# Patient Record
Sex: Male | Born: 1960 | Race: Black or African American | Hispanic: No | Smoking: Current every day smoker
Health system: Southern US, Community
[De-identification: ages and names within clinical notes are randomized; demographics above are authoritative.]

## PROBLEM LIST (undated history)

## (undated) DIAGNOSIS — I509 Heart failure, unspecified: Secondary | ICD-10-CM

## (undated) DIAGNOSIS — E7800 Pure hypercholesterolemia, unspecified: Secondary | ICD-10-CM

## (undated) DIAGNOSIS — E119 Type 2 diabetes mellitus without complications: Secondary | ICD-10-CM

## (undated) DIAGNOSIS — I209 Angina pectoris, unspecified: Secondary | ICD-10-CM

## (undated) DIAGNOSIS — Z9289 Personal history of other medical treatment: Secondary | ICD-10-CM

## (undated) DIAGNOSIS — I639 Cerebral infarction, unspecified: Secondary | ICD-10-CM

## (undated) DIAGNOSIS — I10 Essential (primary) hypertension: Secondary | ICD-10-CM

## (undated) DIAGNOSIS — F419 Anxiety disorder, unspecified: Secondary | ICD-10-CM

## (undated) DIAGNOSIS — F32A Depression, unspecified: Secondary | ICD-10-CM

## (undated) DIAGNOSIS — F329 Major depressive disorder, single episode, unspecified: Secondary | ICD-10-CM

## (undated) DIAGNOSIS — I2510 Atherosclerotic heart disease of native coronary artery without angina pectoris: Secondary | ICD-10-CM

## (undated) DIAGNOSIS — I219 Acute myocardial infarction, unspecified: Secondary | ICD-10-CM

## (undated) HISTORY — DX: Personal history of other medical treatment: Z92.89

## (undated) HISTORY — PX: CHOLECYSTECTOMY: SHX55

---

## 2010-07-20 ENCOUNTER — Emergency Department (HOSPITAL_BASED_OUTPATIENT_CLINIC_OR_DEPARTMENT_OTHER): Admission: EM | Admit: 2010-07-20 | Discharge: 2010-07-21 | Payer: Self-pay | Admitting: Emergency Medicine

## 2010-07-21 ENCOUNTER — Ambulatory Visit: Payer: Self-pay | Admitting: Diagnostic Radiology

## 2010-09-08 DIAGNOSIS — Z9289 Personal history of other medical treatment: Secondary | ICD-10-CM

## 2010-09-08 DIAGNOSIS — I639 Cerebral infarction, unspecified: Secondary | ICD-10-CM

## 2010-09-08 HISTORY — DX: Cerebral infarction, unspecified: I63.9

## 2010-09-08 HISTORY — DX: Personal history of other medical treatment: Z92.89

## 2010-09-08 HISTORY — PX: CORONARY ARTERY BYPASS GRAFT: SHX141

## 2010-10-28 ENCOUNTER — Inpatient Hospital Stay (HOSPITAL_COMMUNITY)
Admission: EM | Admit: 2010-10-28 | Discharge: 2010-11-06 | DRG: 280 | Disposition: A | Discharge status: Home Health | Payer: Medicaid Other | Source: Other Acute Inpatient Hospital | Attending: Cardiovascular Disease | Admitting: Cardiovascular Disease

## 2010-10-28 ENCOUNTER — Emergency Department (INDEPENDENT_AMBULATORY_CARE_PROVIDER_SITE_OTHER): Payer: Self-pay

## 2010-10-28 ENCOUNTER — Emergency Department (HOSPITAL_BASED_OUTPATIENT_CLINIC_OR_DEPARTMENT_OTHER)
Admission: EM | Admit: 2010-10-28 | Discharge: 2010-10-28 | Disposition: A | Discharge status: Other Acute Inpatient Hospital | Payer: Self-pay | Attending: Emergency Medicine | Admitting: Emergency Medicine

## 2010-10-28 DIAGNOSIS — I4729 Other ventricular tachycardia: Secondary | ICD-10-CM | POA: Diagnosis present

## 2010-10-28 DIAGNOSIS — I472 Ventricular tachycardia, unspecified: Secondary | ICD-10-CM | POA: Diagnosis present

## 2010-10-28 DIAGNOSIS — J90 Pleural effusion, not elsewhere classified: Secondary | ICD-10-CM

## 2010-10-28 DIAGNOSIS — F17200 Nicotine dependence, unspecified, uncomplicated: Secondary | ICD-10-CM | POA: Diagnosis present

## 2010-10-28 DIAGNOSIS — I2510 Atherosclerotic heart disease of native coronary artery without angina pectoris: Secondary | ICD-10-CM | POA: Diagnosis present

## 2010-10-28 DIAGNOSIS — I517 Cardiomegaly: Secondary | ICD-10-CM | POA: Insufficient documentation

## 2010-10-28 DIAGNOSIS — I129 Hypertensive chronic kidney disease with stage 1 through stage 4 chronic kidney disease, or unspecified chronic kidney disease: Secondary | ICD-10-CM | POA: Diagnosis present

## 2010-10-28 DIAGNOSIS — I2789 Other specified pulmonary heart diseases: Secondary | ICD-10-CM | POA: Diagnosis present

## 2010-10-28 DIAGNOSIS — I10 Essential (primary) hypertension: Secondary | ICD-10-CM | POA: Insufficient documentation

## 2010-10-28 DIAGNOSIS — I509 Heart failure, unspecified: Secondary | ICD-10-CM | POA: Diagnosis present

## 2010-10-28 DIAGNOSIS — N183 Chronic kidney disease, stage 3 unspecified: Secondary | ICD-10-CM | POA: Diagnosis present

## 2010-10-28 DIAGNOSIS — F1210 Cannabis abuse, uncomplicated: Secondary | ICD-10-CM | POA: Diagnosis present

## 2010-10-28 DIAGNOSIS — I2582 Chronic total occlusion of coronary artery: Secondary | ICD-10-CM | POA: Diagnosis present

## 2010-10-28 DIAGNOSIS — E785 Hyperlipidemia, unspecified: Secondary | ICD-10-CM | POA: Diagnosis present

## 2010-10-28 DIAGNOSIS — R0602 Shortness of breath: Secondary | ICD-10-CM | POA: Insufficient documentation

## 2010-10-28 DIAGNOSIS — I2589 Other forms of chronic ischemic heart disease: Secondary | ICD-10-CM | POA: Diagnosis present

## 2010-10-28 DIAGNOSIS — I428 Other cardiomyopathies: Secondary | ICD-10-CM | POA: Diagnosis present

## 2010-10-28 DIAGNOSIS — Z794 Long term (current) use of insulin: Secondary | ICD-10-CM

## 2010-10-28 DIAGNOSIS — I5021 Acute systolic (congestive) heart failure: Secondary | ICD-10-CM | POA: Diagnosis present

## 2010-10-28 DIAGNOSIS — E119 Type 2 diabetes mellitus without complications: Secondary | ICD-10-CM | POA: Diagnosis present

## 2010-10-28 DIAGNOSIS — I059 Rheumatic mitral valve disease, unspecified: Secondary | ICD-10-CM | POA: Diagnosis present

## 2010-10-28 DIAGNOSIS — E876 Hypokalemia: Secondary | ICD-10-CM | POA: Diagnosis not present

## 2010-10-28 DIAGNOSIS — I214 Non-ST elevation (NSTEMI) myocardial infarction: Principal | ICD-10-CM | POA: Diagnosis present

## 2010-10-28 DIAGNOSIS — Z23 Encounter for immunization: Secondary | ICD-10-CM

## 2010-10-28 LAB — BASIC METABOLIC PANEL
CO2: 29 mEq/L (ref 19–32)
Calcium: 8.3 mg/dL — ABNORMAL LOW (ref 8.4–10.5)
GFR calc Af Amer: >60 mL/min (ref >60)
GFR calc non Af Amer: 58 mL/min — ABNORMAL LOW (ref >60)
Sodium: 139 mEq/L (ref 135–145)

## 2010-10-28 LAB — CBC
Hemoglobin: 14.5 g/dL (ref 13.0–17.0)
MCHC: 32.5 g/dL (ref 30.0–36.0)
Platelets: 341 10*3/uL (ref 150–400)
RDW: 16.4 % — ABNORMAL HIGH (ref 11.5–15.5)

## 2010-10-28 LAB — POCT B-TYPE NATRIURETIC PEPTIDE (BNP): B Natriuretic Peptide, POC: 1390 pg/mL — ABNORMAL HIGH (ref 0–100)

## 2010-10-28 LAB — RAPID URINE DRUG SCREEN, HOSP PERFORMED
Amphetamines: NOT DETECTED
Benzodiazepines: NOT DETECTED
Cocaine: NOT DETECTED
Tetrahydrocannabinol: POSITIVE — AB

## 2010-10-28 LAB — DIFFERENTIAL
Basophils Absolute: 0 10*3/uL (ref 0.0–0.1)
Basophils Relative: 0 % (ref 0–1)
Eosinophils Absolute: 0.1 10*3/uL (ref 0.0–0.7)
Monocytes Absolute: 0.7 10*3/uL (ref 0.1–1.0)
Neutro Abs: 6.6 10*3/uL (ref 1.7–7.7)

## 2010-10-28 LAB — POCT CARDIAC MARKERS: Myoglobin, poc: 274 ng/mL (ref 12–200)

## 2010-10-29 LAB — CBC
HCT: 45.7 % (ref 39.0–52.0)
Hemoglobin: 14.6 g/dL (ref 13.0–17.0)
WBC: 11.9 10*3/uL — ABNORMAL HIGH (ref 4.0–10.5)

## 2010-10-29 LAB — BASIC METABOLIC PANEL
CO2: 33 mEq/L — ABNORMAL HIGH (ref 19–32)
Calcium: 8.3 mg/dL — ABNORMAL LOW (ref 8.4–10.5)
GFR calc Af Amer: 60 mL/min — ABNORMAL LOW (ref >60)
GFR calc non Af Amer: 49 mL/min — ABNORMAL LOW (ref >60)
Glucose, Bld: 106 mg/dL — ABNORMAL HIGH (ref 70–99)
Potassium: 2.9 mEq/L — ABNORMAL LOW (ref 3.5–5.1)
Sodium: 140 mEq/L (ref 135–145)

## 2010-10-29 LAB — GLUCOSE, CAPILLARY
Glucose-Capillary: 74 mg/dL (ref 70–99)
Glucose-Capillary: 132 mg/dL — ABNORMAL HIGH (ref 70–99)
Glucose-Capillary: 164 mg/dL — ABNORMAL HIGH (ref 70–99)

## 2010-10-29 LAB — CK TOTAL AND CKMB (NOT AT ARMC)
CK, MB: 3.8 ng/mL (ref 0.3–4.0)
Relative Index: 1.2 (ref 0.0–2.5)

## 2010-10-29 LAB — HEPARIN LEVEL (UNFRACTIONATED)
Heparin Unfractionated: 0.17 IU/mL — ABNORMAL LOW (ref 0.30–0.70)
Heparin Unfractionated: 0.51 IU/mL (ref 0.30–0.70)

## 2010-10-29 LAB — LIPID PANEL
Cholesterol: 166 mg/dL (ref 0–200)
HDL: 33 mg/dL — ABNORMAL LOW (ref >39)
Triglycerides: 66 mg/dL (ref <150)

## 2010-10-29 LAB — BRAIN NATRIURETIC PEPTIDE: Pro B Natriuretic peptide (BNP): 1582 pg/mL — ABNORMAL HIGH (ref 0.0–100.0)

## 2010-10-29 LAB — HEMOGLOBIN A1C: Hgb A1c MFr Bld: 8.6 % — ABNORMAL HIGH (ref <5.7)

## 2010-10-29 LAB — TROPONIN I: Troponin I: 7.26 ng/mL (ref 0.00–0.06)

## 2010-10-30 ENCOUNTER — Inpatient Hospital Stay (HOSPITAL_COMMUNITY): Payer: Medicaid Other

## 2010-10-30 LAB — CBC
Platelets: 279 10*3/uL (ref 150–400)
RBC: 4.86 MIL/uL (ref 4.22–5.81)
RDW: 15.5 % (ref 11.5–15.5)
WBC: 11 10*3/uL — ABNORMAL HIGH (ref 4.0–10.5)

## 2010-10-30 LAB — HEPARIN LEVEL (UNFRACTIONATED): Heparin Unfractionated: 0.37 IU/mL (ref 0.30–0.70)

## 2010-10-30 LAB — BRAIN NATRIURETIC PEPTIDE: Pro B Natriuretic peptide (BNP): 834 pg/mL — ABNORMAL HIGH (ref 0.0–100.0)

## 2010-10-30 LAB — GLUCOSE, CAPILLARY
Glucose-Capillary: 130 mg/dL — ABNORMAL HIGH (ref 70–99)
Glucose-Capillary: 47 mg/dL — ABNORMAL LOW (ref 70–99)
Glucose-Capillary: 52 mg/dL — ABNORMAL LOW (ref 70–99)

## 2010-10-30 LAB — BASIC METABOLIC PANEL
Calcium: 8.1 mg/dL — ABNORMAL LOW (ref 8.4–10.5)
GFR calc Af Amer: 57 mL/min — ABNORMAL LOW (ref >60)
GFR calc non Af Amer: 47 mL/min — ABNORMAL LOW (ref >60)
Sodium: 141 mEq/L (ref 135–145)

## 2010-10-30 NOTE — Consult Note (Signed)
NAMEMYRAN, Steven NO.:  [id number]  MEDICAL RECORD NO.:  [id number]           PATIENT TYPE:  I  LOCATION:  2926                         FACILITY:  [facility]  PHYSICIAN:  Mariea Stable, MD   DATE OF BIRTH:  [birth year]  DATE OF CONSULTATION:  10/28/2010 DATE OF DISCHARGE:                                CONSULTATION   REASON FOR CONSULTATION:  Glycemic control.  CHIEF COMPLAINT:  Dyspnea on exertion.  HISTORY OF PRESENT ILLNESS:  [name] a 50 year old gentleman with history significant for insulin-dependent diabetes mellitus, hypertension, hyperlipidemia who presents with chief complaint of dyspnea on exertion.  He states that he has been having shortness of breath for approximately 2 weeks in duration that has been progressive in nature.  He states that before this he was in his usual state of health.  He has had some chronic lower extremity swelling for some time, although he attributed this some time back to working on his feet for 12 hours initially and 8 hours per day more recently.  However, over this 2- week period, the dyspnea on exertion has been progressive to the point where he can walk more than just a few feet without getting short of breath.  He also reports episodes of PND where he will wake and gasp for air in the middle of the night, although he states it does not happen all that frequently.  He also endorses some orthopnea for which he uses two pillows.  He states that he has never had symptoms like this before. Of note, he denies ever having any chest pain or palpitations during this time period or in the past.  PAST MEDICAL HISTORY: 1. Insulin-dependent diabetes mellitus. 2. Hypertension. 3. Hyperlipidemia. 4. Substance use. 5. History of cholecystectomy.  MEDICATIONS:  The patient uses insulin 70/30 45 units subcu before breakfast and 35 units subcu before dinner.  ALLERGIES:  No known drug allergies.  SOCIAL HISTORY:  The  patient is currently separated and lives in [place].  He has a total of four siblings which include three sisters and one brother.  The youngest sister is currently here at bedside and he states she is one to be contacted in case of a medical emergency or decision-making needs to take place and her name is Canary Brim, phone number is [phone].  The patient reports smoking less than one pack per day for approximately 30 years.  He drinks alcohol socially, though he states that this is rather seldom.  He also admits to using no occasional marijuana.  FAMILY HISTORY:  Mother is alive at age 38 and has a history of hypertension, hypothyroidism.  Father is passed away in his 65s secondary to cirrhosis of the liver from alcohol use.  As stated above, the patient has four siblings and they have hypertension, diabetes and asthma.  REVIEW OF SYSTEMS:  As per HPI.  All others reviewed and negative.  PHYSICAL EXAMINATION:  VITAL SIGNS:  Blood pressure 180/138, heart rate of 87, oxygen saturation 100% on 2 liters via nasal cannula. GENERAL:  This is a middle-aged man lying in bed in no acute  distress. HEENT:  Head is normocephalic and atraumatic.  Pupils are equally round, reactive to light and accommodation.  Extraocular movements are intact. Sclerae anicteric.  There is poor dentition with missing teeth.  There are no oropharyngeal lesions. NECK:  Supple.  There is elevated jugular venous pressures with hepatojugular reflux. HEART:  There is an S1 and S2 with a regular rate and rhythm.  There is an S3 gallop with elevated JVP and hepatojugular reflux. LUNGS:  There is good air movement bilaterally, though there are crackles in the lower two thirds of the lung fields. ABDOMEN:  Positive bowel sounds, soft, nontender, nondistended with no rebound or guarding. LOWER EXTREMITIES:  There is +1 edema in the right lower extremity. There is +2 edema in the left lower extremity. NEUROLOGIC:   The patient is awake, alert and oriented x3.  Cranial nerves II through XII are intact.  Motor is intact.  Sensation is intact.  LABORATORY DATA:  WBCs 9.1, hemoglobin 14.5, platelet count 341.  Sodium 139, potassium 3.7, chloride 101, bicarb 29, glucose 189, BUN 15, creatinine 1.3, calcium 8.3.  BNP 1390.  Point-of-care cardiac markers show a myoglobin of 274, CK-MB of 2.7, and troponin-I of 4.99.  IMAGING: 1. Two-view chest x-ray shows bilateral lower lobe opacities,     suspicious for pneumonia.  There is also a right pleural effusion     with associated right lower lobe atelectasis and mild cardiomegaly. 2. Electrocardiogram shows a normal sinus rhythm with a ventricular     rate of 94.  There is left axis deviation associated with left     ventricular hypertrophy.  There are some nonspecific ST     abnormalities, but no obvious ischemic changes.  ASSESSMENT AND PLAN: 1. Congestive heart failure management currently per Cardiology.  The     patient is currently on a nitroglycerin drip along with some oral     antihypertensives.  He is also getting intravenous Lasix for     aggressive diuresis.  A 2-D echocardiogram is currently pending.ACE inhibitor has been initiated and beta-blocker will be initiated     if left ventricular function is found to be abnormal once the     patient has been diuresed and pulmonary edema has improved per     Cardiology. 2. Non-ST segment elevation myocardial infarction.  This may be     related to his congestive heart failure with markedly elevated     blood pressures.  However, he currently is on a heparin drip along     with a nitroglycerin drip as listed above.  He will also be started     on aspirin 325 mg p.o. daily.  Again, beta-blockers has currently     not been started secondary to his congestive heart failure and     pulmonary edema, although this will be started once this improves.     Again, 2-D echocardiogram is pending.  EKG does not  show any acute     ischemic changes and cardiac enzymes are being cycled currently. 3. Insulin-dependent diabetes mellitus.  The patient uses insulin     70/30 45 units before breakfast and 35 units before dinner.  At     this point, would continue with this regimen as this is the least     expensive insulin regimen.  However, we will not continue any     sliding scale insulin as the 70/30 contains short-acting insulin     and may cause some unintentional hypoglycemia.  We will check CBGs     before breakfast and before dinner to help provide information for     further adjustment of his 70/30.  Also was agree with ACE inhibitor     for blood pressure control given his congestive heart failure as     well as diabetes.  The patient will put in an order for diabetes     education.  Furthermore, the patient needs to be followed on an     outpatient basis for better control of his diabetes.  He would also     require an ophthalmology exam to screen for diabetic retinopathy as     an outpatient.  Also they can consider checking a urine     microalbumin to creatinine ratio for proteinuria, although the     patient has already been started on an ACE inhibitor, which would     be the treatment for this. 4. Hypertension.  Again, the patient is currently on a nitroglycerin     drip per #1.  He has also been started on ACE inhibitor as well as     calcium channel blocker.  The patient will likely be started on the     beta-blocker once his pulmonary edema resolves especially if his     left ventricular function is down.  Thank you for the opportunity to assist with the management of your patient.     Mariea Stable, MD     MA/MEDQ  D:  10/28/2010  T:  10/28/2010  Job:  [id number]  Electronically Signed by Mariea Stable MD on 10/30/2010 04:11:39 PM

## 2010-10-30 NOTE — Consult Note (Signed)
NAME:  LARKIN, MORELOS NO.:  [id number]  MEDICAL RECORD NO.:  [id number]           PATIENT TYPE:  I  LOCATION:  2926                         FACILITY:  [facility]  PHYSICIAN:  Nanetta Batty, M.D.   DATE OF BIRTH:  Sep 16, 1960  DATE OF CONSULTATION:  10/28/2010 DATE OF DISCHARGE:                                CONSULTATION   Mr. Fritze is a 50 year old separated African American male, father of 1 child, who works at [employer], and was admitted through [facility] for congestive heart failure and hypertension.  PAST MEDICAL HISTORY:  Remarkable for insulin-dependent diabetes over the last 8-10 years.  Despite having no primary care doctor, he still administers insulin to himself twice a day.  His cardiac risk factor profile is otherwise remarkable for tobacco abuse, smoking a thirds to half a pack a day for the last 25-30 years.  There is a history of hypertension, but he has not been on any antihypertensive medications. He has no family history that he knows of.  Over the last 2 weeks, he has noticed increasing dyspnea on exertion, lower extremity edema.  He denies chest pain.  He presented to [facility] with shortness of breath.  At that time, his exam was remarkable for elevated blood pressure, measured at 193/139.  His chest x-ray was remarkable for congestive heart failure.  His EKG showed LVH. His lab work was remarkable for an elevated BNP at 1400 and point-of- care marker troponin of 5.  He was heparinized, treated with IV diuretics and nitroglycerin and transferred to [facility] for further evaluation.  SURGICAL HISTORY:  Cholecystectomy.  ALLERGIES:  None.  MEDICATIONS:  Insulin 70/30, 45 units in the morning and 35 in the evening.  PHYSICAL EXAMINATION:  VITAL SIGNS:  Blood pressure is 188/130, pulse is 70. GENERAL:  The patient is alert and oriented, neurologically intact. LUNGS:  Reveal bibasilar crackles. NECK:  Carotids are  2+.  No bruits. HEART:  Reveals soft ejection murmur with a summation gallop. ABDOMEN:  Soft and nontender with active bowel sounds.  There is no hepatosplenomegaly, organomegaly, or audible bruits.  Femoral arteries are 2+ without bruits.  He does have 2+ pitting edema with diminished pedal pulses.  Twelve-lead EKG reveals sinus rhythm at 94 with evidence of LVH.  LABORATORY EXAM:  BNP 1390, sodium 139, potassium 3.7, chloride 101, CO2 29, glucose 189, BUN 15, creatinine 1.3.  Hemoglobin 14.5, hematocrit 44.6, platelet count 341, white count 9.1.  Chest x-ray shows cardiac enlargement with basilar infiltrates and interstitial edema.  IMPRESSION:  Mr. Bethel has congestive heart failure on exam and by biomarkers and chest x-ray.  He also has significant hypertension.  His risk factors in addition include insulin-dependent diabetes and ongoing tobacco abuse.  His laboratory exam shows preserved renal function and elevated troponin consistent with non-ST segment elevation myocardial infarction versus related to his CHF.  PLAN:  Will be to IV diurese him, treat his blood pressure with titratable intravenous nitrates, ACE inhibitors, and calcium channel blockers.  We will hold off on beta-blockers for the moment given the fact that he is in congestive  heart failure.  We will obtain a 2-D echo, cycle his enzymes.  I am also going to get the hospitalist service to assist [place] with the treatment of his diabetes and diabetic teaching nurse to seem him as well.  He may ultimately require right and left heart cath to define his anatomy and physiology.     Nanetta Batty, M.D.     Cordelia Pen  D:  10/28/2010  T:  10/29/2010  Job:  [id number]  cc:   [facility]  Electronically Signed by Nanetta Batty M.D. on 10/30/2010 11:35:46 AM

## 2010-10-31 DIAGNOSIS — Z01810 Encounter for preprocedural cardiovascular examination: Secondary | ICD-10-CM

## 2010-10-31 DIAGNOSIS — I2510 Atherosclerotic heart disease of native coronary artery without angina pectoris: Secondary | ICD-10-CM

## 2010-10-31 LAB — BRAIN NATRIURETIC PEPTIDE: Pro B Natriuretic peptide (BNP): 688 pg/mL — ABNORMAL HIGH (ref 0.0–100.0)

## 2010-10-31 LAB — BASIC METABOLIC PANEL
CO2: 32 mEq/L (ref 19–32)
Calcium: 8.5 mg/dL (ref 8.4–10.5)
Chloride: 99 mEq/L (ref 96–112)
GFR calc Af Amer: 55 mL/min — ABNORMAL LOW (ref >60)
Potassium: 3.8 mEq/L (ref 3.5–5.1)
Sodium: 139 mEq/L (ref 135–145)

## 2010-10-31 LAB — GLUCOSE, CAPILLARY
Glucose-Capillary: 208 mg/dL — ABNORMAL HIGH (ref 70–99)
Glucose-Capillary: 140 mg/dL — ABNORMAL HIGH (ref 70–99)

## 2010-10-31 LAB — CBC
Hemoglobin: 14.3 g/dL (ref 13.0–17.0)
MCH: 25.7 pg — ABNORMAL LOW (ref 26.0–34.0)
RBC: 5.56 MIL/uL (ref 4.22–5.81)

## 2010-11-01 LAB — CBC
HCT: 43.9 % (ref 39.0–52.0)
MCH: 25.8 pg — ABNORMAL LOW (ref 26.0–34.0)
MCHC: 31.7 g/dL (ref 30.0–36.0)
MCV: 81.6 fL (ref 78.0–100.0)
RDW: 15.6 % — ABNORMAL HIGH (ref 11.5–15.5)

## 2010-11-01 LAB — POCT I-STAT 3, VENOUS BLOOD GAS (G3P V)
TCO2: 33 mmol/L (ref 0–100)
pCO2, Ven: 43.9 mmHg — ABNORMAL LOW (ref 45.0–50.0)
pH, Ven: 7.462 — ABNORMAL HIGH (ref 7.250–7.300)
pO2, Ven: 27 mmHg — CL (ref 30.0–45.0)

## 2010-11-01 LAB — COMPREHENSIVE METABOLIC PANEL
Albumin: 2.3 g/dL — ABNORMAL LOW (ref 3.5–5.2)
Alkaline Phosphatase: 148 U/L — ABNORMAL HIGH (ref 39–117)
BUN: 20 mg/dL (ref 6–23)
Creatinine, Ser: 1.71 mg/dL — ABNORMAL HIGH (ref 0.4–1.5)
Potassium: 4 mEq/L (ref 3.5–5.1)
Total Protein: 6 g/dL (ref 6.0–8.3)

## 2010-11-01 LAB — GLUCOSE, CAPILLARY
Glucose-Capillary: 175 mg/dL — ABNORMAL HIGH (ref 70–99)
Glucose-Capillary: 143 mg/dL — ABNORMAL HIGH (ref 70–99)

## 2010-11-01 LAB — POCT I-STAT 3, ART BLOOD GAS (G3+)
Acid-Base Excess: 4 mmol/L — ABNORMAL HIGH (ref 0.0–2.0)
Acid-Base Excess: 7 mmol/L — ABNORMAL HIGH (ref 0.0–2.0)
Acid-Base Excess: 9 mmol/L — ABNORMAL HIGH (ref 0.0–2.0)
Bicarbonate: 31.6 mEq/L — ABNORMAL HIGH (ref 20.0–24.0)
Bicarbonate: 31.7 mEq/L — ABNORMAL HIGH (ref 20.0–24.0)
O2 Saturation: 78 %
O2 Saturation: 83 %
pCO2 arterial: 46.8 mmHg — ABNORMAL HIGH (ref 35.0–45.0)
pO2, Arterial: 46 mmHg — ABNORMAL LOW (ref 80.0–100.0)
pO2, Arterial: 70 mmHg — ABNORMAL LOW (ref 80.0–100.0)

## 2010-11-02 LAB — BASIC METABOLIC PANEL
BUN: 20 mg/dL (ref 6–23)
Creatinine, Ser: 1.54 mg/dL — ABNORMAL HIGH (ref 0.4–1.5)
GFR calc non Af Amer: 48 mL/min — ABNORMAL LOW (ref >60)

## 2010-11-02 LAB — CBC
MCH: 25.2 pg — ABNORMAL LOW (ref 26.0–34.0)
MCHC: 31.2 g/dL (ref 30.0–36.0)
MCV: 80.8 fL (ref 78.0–100.0)
Platelets: 265 10*3/uL (ref 150–400)
RDW: 15.6 % — ABNORMAL HIGH (ref 11.5–15.5)

## 2010-11-02 LAB — MAGNESIUM: Magnesium: 2.1 mg/dL (ref 1.5–2.5)

## 2010-11-03 LAB — CBC
MCH: 25.2 pg — ABNORMAL LOW (ref 26.0–34.0)
Platelets: 260 10*3/uL (ref 150–400)
RBC: 5.24 MIL/uL (ref 4.22–5.81)
WBC: 8 10*3/uL (ref 4.0–10.5)

## 2010-11-03 LAB — GLUCOSE, CAPILLARY
Glucose-Capillary: 143 mg/dL — ABNORMAL HIGH (ref 70–99)
Glucose-Capillary: 137 mg/dL — ABNORMAL HIGH (ref 70–99)
Glucose-Capillary: 135 mg/dL — ABNORMAL HIGH (ref 70–99)

## 2010-11-03 LAB — BASIC METABOLIC PANEL
Chloride: 98 mEq/L (ref 96–112)
Creatinine, Ser: 1.52 mg/dL — ABNORMAL HIGH (ref 0.4–1.5)
GFR calc Af Amer: 59 mL/min — ABNORMAL LOW (ref >60)
GFR calc non Af Amer: 49 mL/min — ABNORMAL LOW (ref >60)
Potassium: 3.8 mEq/L (ref 3.5–5.1)

## 2010-11-03 LAB — BRAIN NATRIURETIC PEPTIDE: Pro B Natriuretic peptide (BNP): 993 pg/mL — ABNORMAL HIGH (ref 0.0–100.0)

## 2010-11-04 ENCOUNTER — Inpatient Hospital Stay (HOSPITAL_COMMUNITY): Payer: Medicaid Other

## 2010-11-04 DIAGNOSIS — I2510 Atherosclerotic heart disease of native coronary artery without angina pectoris: Secondary | ICD-10-CM

## 2010-11-04 LAB — CBC
MCHC: 30.9 g/dL (ref 30.0–36.0)
Platelets: 235 10*3/uL (ref 150–400)
RDW: 15.5 % (ref 11.5–15.5)

## 2010-11-04 LAB — BASIC METABOLIC PANEL
Calcium: 8.7 mg/dL (ref 8.4–10.5)
Creatinine, Ser: 1.43 mg/dL (ref 0.4–1.5)
GFR calc Af Amer: >60 mL/min (ref >60)
GFR calc non Af Amer: 52 mL/min — ABNORMAL LOW (ref >60)
Sodium: 137 mEq/L (ref 135–145)

## 2010-11-04 LAB — GLUCOSE, CAPILLARY
Glucose-Capillary: 182 mg/dL — ABNORMAL HIGH (ref 70–99)
Glucose-Capillary: 127 mg/dL — ABNORMAL HIGH (ref 70–99)
Glucose-Capillary: 146 mg/dL — ABNORMAL HIGH (ref 70–99)

## 2010-11-05 LAB — BASIC METABOLIC PANEL
CO2: 32 mEq/L (ref 19–32)
Calcium: 9.1 mg/dL (ref 8.4–10.5)
Creatinine, Ser: 1.5 mg/dL (ref 0.4–1.5)
GFR calc Af Amer: 60 mL/min — ABNORMAL LOW (ref >60)
GFR calc non Af Amer: 49 mL/min — ABNORMAL LOW (ref >60)
Glucose, Bld: 138 mg/dL — ABNORMAL HIGH (ref 70–99)
Sodium: 140 mEq/L (ref 135–145)

## 2010-11-05 LAB — CBC
HCT: 43.7 % (ref 39.0–52.0)
MCH: 26 pg (ref 26.0–34.0)
MCHC: 31.8 g/dL (ref 30.0–36.0)
RDW: 15.7 % — ABNORMAL HIGH (ref 11.5–15.5)

## 2010-11-05 LAB — GLUCOSE, CAPILLARY
Glucose-Capillary: 154 mg/dL — ABNORMAL HIGH (ref 70–99)
Glucose-Capillary: 84 mg/dL (ref 70–99)
Glucose-Capillary: 158 mg/dL — ABNORMAL HIGH (ref 70–99)

## 2010-11-06 LAB — GLUCOSE, CAPILLARY
Glucose-Capillary: 109 mg/dL — ABNORMAL HIGH (ref 70–99)
Glucose-Capillary: 243 mg/dL — ABNORMAL HIGH (ref 70–99)
Glucose-Capillary: 105 mg/dL — ABNORMAL HIGH (ref 70–99)

## 2010-11-06 LAB — HEPATIC FUNCTION PANEL
AST: 25 U/L (ref 0–37)
Albumin: 2.4 g/dL — ABNORMAL LOW (ref 3.5–5.2)
Total Bilirubin: 0.9 mg/dL (ref 0.3–1.2)
Total Protein: 6.1 g/dL (ref 6.0–8.3)

## 2010-11-06 LAB — CBC
Hemoglobin: 13.8 g/dL (ref 13.0–17.0)
MCH: 25.2 pg — ABNORMAL LOW (ref 26.0–34.0)
MCHC: 31.5 g/dL (ref 30.0–36.0)
Platelets: 269 10*3/uL (ref 150–400)
RDW: 15.5 % (ref 11.5–15.5)

## 2010-11-06 LAB — BASIC METABOLIC PANEL
Calcium: 9.1 mg/dL (ref 8.4–10.5)
Creatinine, Ser: 1.57 mg/dL — ABNORMAL HIGH (ref 0.4–1.5)
GFR calc Af Amer: 57 mL/min — ABNORMAL LOW (ref >60)
GFR calc non Af Amer: 47 mL/min — ABNORMAL LOW (ref >60)
Sodium: 136 mEq/L (ref 135–145)

## 2010-11-06 LAB — BRAIN NATRIURETIC PEPTIDE: Pro B Natriuretic peptide (BNP): 727 pg/mL — ABNORMAL HIGH (ref 0.0–100.0)

## 2010-11-07 NOTE — Procedures (Signed)
NAMEGRAY, DOERING NO.:  [id number]  MEDICAL RECORD NO.:  [id number]           PATIENT TYPE:  LOCATION:                                 FACILITY:  PHYSICIAN:  Thereasa Solo. Jessicah Croll, M.D. DATE OF BIRTH:  1961/04/09  DATE OF PROCEDURE:  10/31/2010 DATE OF DISCHARGE:                           CARDIAC CATHETERIZATION   INDICATIONS FOR TEST:  This 50 year old male was admitted on October 28, 2010, with congestive heart failure and a non-Q-wave MI.  An echocardiogram shows an ejection fraction of approximately 20%.  His creatinine is elevated at 1.63, and because of this a formal ventriculogram was not performed in an attempt to conserve contrast media.  PROCEDURE:  The patient was prepped and draped in the usual sterile fashion exposing the right groin.  Following local anesthetic with 1% Xylocaine, the Seldinger technique was employed and a 5-French introducer sheath was placed in the right femoral artery and a 7-French introducer sheath in the right femoral vein.  A Swan-Ganz catheter was advanced through its normal route into the pulmonary artery with hemodynamic monitoring undertaken throughout each station.  Following this, cardiac output by thermodilution was performed and pulmonary artery saturations were obtained.  Left and right coronary arteriography and a hand injection through a 5- French catheter was performed.  COMPLICATIONS:  None.  TOTAL CONTRAST USED:  50 mL.  EQUIPMENT:  A 5-French diagnostic Judkins configuration catheters and a 6-French Swan-Ganz catheter.  I had to use a Swan wire to pass the Swan-Ganz catheter out of the right atrium.  The right atrium appeared to be massive.  RESULTS:  Hemodynamic monitoring:  Right atrial pressure 12, right ventricular pressure 55/2, pulmonary artery pressure 61/26, wedge was 30.  Central aortic pressure 131/90, left ventricular pressure 136/13, and the left ventricular end-diastolic pressure was  26.  Oxygen saturations on room air in the AO 96% and in the PA 53%.  Cardiac output by Fick 3.4 and by thermal dilution 3.6.  Cardiac index by Fick was 1.59 and 1.6 by thermo.  Oxygen saturations AO 96%, PA 53%.  VENTRICULOGRAPHY:  There was no calcification appreciated on fluoroscopy.  ANGIOGRAPHY: 1. Left main was normal. 2. Circumflex:  The circumflex was 100% occluded in its proximal     portion with right-to-left collaterals. 3. Ramus intermediate:  The ramus was a large vessel, long in length     and bifurcated.  In the midportion of the ramus prior to the     bifurcation was a proximal 80% area of narrowing. 4. LAD:  The LAD crossed the apex of the heart, in the midportion was     an 80% tubular area of narrowing that was about 15 mm in length.     The diagonal system was free of disease. 5. Right coronary artery:  This was a large dominant vessel and about     3 mm in diameter.  The proximal, mid, and distal right coronary     artery had mild irregularities.  The PDA and three posterolateral     vessels were visualized, and after the second posterolateral vessel     was  an area of subtotaled narrowing.  Hand injection into the left ventricle in an attempt to save contrast revealed the apical and anterior segments to be akinetic.  I could not calculate an ejection fraction but by echo it was 20%.  CONCLUSION: 1. Severe left ventricular dysfunction with an ejection fraction less     than 20% by echocardiogram. 2. 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 3. Pulmonary hypertension with pulmonary artery pressure of 61/26. 4. Massive right atrium. 5. Low cardiac index.  PLAN:  I have asked CVTS to evaluate the patient for consideration of revascularization once he is more stable.  I plan to aggressively treat his congestive failure and make sure that his renal insufficiency remains stable.           ______________________________ Thereasa Solo. Sanye Ledesma, M.D.     ABL/MEDQ  D:  10/31/2010  T:  10/31/2010  Job:  [id number]  cc:   Ritta Slot, MD CVTS.  Electronically Signed by Julieanne Manson M.D. on 11/07/2010 07:58:15 AM

## 2010-11-12 NOTE — Consult Note (Addendum)
NAMESEAN, MALINOWSKI NO.:  [id number]  MEDICAL RECORD NO.:  [id number]           PATIENT TYPE:  I  LOCATION:  2926                         FACILITY:  [facility]  PHYSICIAN:  Salvatore Decent. Dorris Fetch, M.D.DATE OF BIRTH:  19-Jan-1961  DATE OF CONSULTATION:  10/31/2010 DATE OF DISCHARGE:                                CONSULTATION   REASON FOR CONSULTATION:  Three-vessel coronary artery disease and congestive heart failure.  HISTORY OF PRESENT ILLNESS:  Mr. Bonn is a 50 year old gentleman with multiple cardiac risk factors including insulin-dependent diabetes, hypertension, hyperlipidemia.  He also has a history of substance abuse. He presented with a chief complaint of shortness of breath which had primarily been with exertion, started about 2 weeks prior to admission, but it had been progressive in nature, and now is having some episodes at rest.  He in retrospect realized that he has had some leg swelling that has really been persistent since November.  He had first noted this about a year ago when he started a new job where he was up on his feet for long periods of time.  It had resolved, but then in November it came back and just was persistent from that point on.  About 2 weeks ago, he began having dyspnea on exertion.  Initially was walking fair distance, but it got to the point where even going to the bathroom or walking across the room, he would get short of breath and had to gasp for air in the middle of the night, and would have to sleep on 2 pillows.  He denies any chest pain or tightness.  He was admitted on February 20, noted to be in congestive heart failure and also ruled in for a non-ST elevation MI with a troponin of 11.42.  His BNP was 1390.  On admission back in November, his BNP had been 582.  An echocardiogram was done, which revealed severe left ventricular dysfunction.  There was moderate to severe left ventricular hypertrophy and ejection  fraction was markedly reduced at 15-20%.  There was severe hypokinesis of the basal mid anteroseptal myocardium and the entire anterior myocardium.  There was mild mitral regurgitation.  Right ventricle was dilated with severely reduced systolic function.  There was mild tricuspid regurgitation.  Today, he underwent cardiac catheterization by Dr. Julieanne Manson where he was found to have elevated filling pressures. His right atrial pressure was 14, RV 55/2, PA 61/26 with a wedge of 30. His LVEDP was 26.  Cardiac output was 3.4 with an index of 1.59 by Fick, and 3.69 and 1.68 by thermodilution.  His coronary arteries showed occlusion of left circumflex.  Ramus vessel was large.  There was mid to distal 80% stenosis.  The LAD was diffusely diseased.  There was a segmental stenosis in 70-80% range in the midvessel.  The right coronary gave off multiple posterior descending and posterolateral branches, which were diseased.  Minimal contrast was used for the LV gram, but it was noted that there was severe left ventricular dysfunction.  PAST MEDICAL HISTORY: 1. Hypertension. 2. Hyperlipidemia. 3. Insulin-dependent diabetes, which has not been followed closely.  He has been managing himself. 4. Substance abuse.  PAST SURGICAL HISTORY:  Cholecystectomy.  MEDICATIONS PRIOR TO ADMISSION:  Insulin 70/30, 45 units subcu before breakfast, 35 units subcu before dinner.  He has no known drug allergies.  FAMILY HISTORY:  Significant for hypertension.  His father died of cirrhosis at a young age.  SOCIAL HISTORY:  The patient is separated from his wife.  He lives in [place].  He works at [employer].  Occasionally uses marijuana. Drinks socially.  Denies tobacco abuse.  REVIEW OF SYSTEMS:  See HPI.  All other systems are negative.  PHYSICAL EXAMINATION:  GENERAL:  Mr. Braley is a 50 year old gentleman, in no acute distress.  He appears well developed and well nourished. VITAL SIGNS:   Blood pressure is 169/108, pulse 75, respirations are 16, his oxygen saturation is 97% on 2 liters nasal cannula. NEUROLOGIC:  He is alert and oriented x3 with no focal deficits. HEENT:  Remarkable for poor dentition with missing teeth. NECK:  Supple.  He does have elevated jugular venous waves. CARDIAC:  Has a regular rate and rhythm with S1 and S2.  There is an S3. I do not hear a murmur. LUNGS:  Have some mild rhonchi.  There are crackles in the bases. ABDOMEN:  Mildly distended, soft, nontender. EXTREMITIES:  There is chronic venous stasis changes in both lower extremities.  Pulses are difficult to palpate.  LABORATORY DATA:  Cardiac catheterization and echocardiogram as noted. Initial myoglobin was 274.  Initial troponin 4.99.  CK-MBs were negative, but troponin rose to 11.42.  Hemoglobin A1c was 8.6 with a mean plasma glucose of 200.  His white count is 9.9, hematocrit 45, platelets 291.  Sodium 139, potassium 3.8, BUN 17, creatinine 1.6.  IMPRESSION:  Mr. Curci is a 50 year old gentleman who presents with congestive heart failure.  He has severe left ventricular dysfunction. He also has 3-vessel coronary disease.  However, the degree of left ventricular dysfunction appears to be out of proportion to the severity of his coronary artery disease making for a difficult clinical situation.  He does not have tight proximal lesions and his coronary arteries are rather diffusely diseased, so unclear how much revascularization would benefit him in this setting.  It is also unclear to what degree his left ventricular dysfunction is due to his coronary artery disease versus possible other factors.  At the present time, he is receiving appropriate medical management for decompensated heart failure, and over the next several days, we can reassess him and consider repeat echocardiogram to see if there is any improvement in wall motion or we could do a viability scan to assess the degree  to which the myocardium is viable, although that would not necessarily give [place] a direct indication of how much benefit he would get from revascularization again given the nature of his coronary artery disease. We will follow along with the patient clinically and see what develops.     Salvatore Decent Dorris Fetch, M.D.     SCH/MEDQ  D:  10/31/2010  T:  11/01/2010  Job:  [id number]  cc:   Thereasa Solo. Little, M.D.  Electronically Signed by Charlett Lango M.D. on 11/12/2010 12:28:39 PM Electronically Signed by Charlett Lango M.D. on 11/12/2010 12:40:01 PM Electronically Signed by Charlett Lango M.D. on 11/12/2010 12:58:11 PM Electronically Signed by Charlett Lango M.D. on 11/12/2010 01:16:51 PM Electronically Signed by Charlett Lango M.D. on 11/12/2010 01:38:43 PM Electronically Signed by Charlett Lango M.D. on 11/12/2010 01:58:43 PM Electronically Signed by Viviann Spare  Evelisse Szalkowski M.D. on 11/12/2010 02:18:37 PM Electronically Signed by Charlett Lango M.D. on 11/12/2010 02:41:08 PM Electronically Signed by Charlett Lango M.D. on 11/12/2010 03:04:26 PM Electronically Signed by Charlett Lango M.D. on 11/12/2010 03:29:18 PM Electronically Signed by Charlett Lango M.D. on 11/12/2010 03:55:21 PM Electronically Signed by Charlett Lango M.D. on 11/12/2010 04:03:08 PM Electronically Signed by Charlett Lango M.D. on 11/12/2010 04:34:19 PM Electronically Signed by Charlett Lango M.D. on 11/12/2010 05:05:48 PM Electronically Signed by Charlett Lango M.D. on 11/12/2010 05:05:48 PM Electronically Signed by Charlett Lango M.D. on 11/12/2010 05:33:23 PM Electronically Signed by Charlett Lango M.D. on 11/12/2010 05:33:23 PM Electronically Signed by Charlett Lango M.D. on 11/12/2010 06:15:50 PM Electronically Signed by Charlett Lango M.D. on 11/12/2010 06:23:16 PM Electronically Signed by Charlett Lango M.D. on 11/12/2010 07:36:57 PM

## 2010-11-19 LAB — URINALYSIS, ROUTINE W REFLEX MICROSCOPIC
Bilirubin Urine: NEGATIVE
Ketones, ur: NEGATIVE mg/dL
Leukocytes, UA: NEGATIVE
Nitrite: NEGATIVE
Protein, ur: 30 mg/dL — AB
Urobilinogen, UA: 1 mg/dL (ref 0.0–1.0)

## 2010-11-19 LAB — COMPREHENSIVE METABOLIC PANEL
ALT: 27 U/L (ref 0–53)
Alkaline Phosphatase: 87 U/L (ref 39–117)
BUN: 15 mg/dL (ref 6–23)
CO2: 29 mEq/L (ref 19–32)
GFR calc non Af Amer: 58 mL/min — ABNORMAL LOW (ref >60)
Glucose, Bld: 109 mg/dL — ABNORMAL HIGH (ref 70–99)
Potassium: 3.2 mEq/L — ABNORMAL LOW (ref 3.5–5.1)
Sodium: 144 mEq/L (ref 135–145)
Total Bilirubin: 1.1 mg/dL (ref 0.3–1.2)
Total Protein: 6.7 g/dL (ref 6.0–8.3)

## 2010-11-19 LAB — POCT B-TYPE NATRIURETIC PEPTIDE (BNP): B Natriuretic Peptide, POC: 582 pg/mL — ABNORMAL HIGH (ref 0–100)

## 2010-11-19 LAB — URINE MICROSCOPIC-ADD ON

## 2010-11-20 NOTE — Discharge Summary (Signed)
[name] NO.:  [id number]  MEDICAL RECORD NO.:  [id number]           PATIENT TYPE:  I  LOCATION:  4707                         FACILITY:  [facility]  PHYSICIAN:  [name], M.D.   DATE OF BIRTH:  [birth year]  DATE OF ADMISSION:  10/28/2010 DATE OF DISCHARGE:  11/06/2010                              DISCHARGE SUMMARY   DISCHARGE DIAGNOSES: 1. Acute systolic heart failure, which is a new diagnosis. 2. Non-ST elevation myocardial infarction. 3. 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 4. Nonsustained ventricular tachycardia. 5. Coronary artery disease with 100% proximally occluded circ with     right-to-left collaterals, ramus with 80% stenosis, LAD with 80%     mid stenosis, RCA dominant vessel with minor irregularities in     proximal mid and distal vessels.     a.     [facility] vascular surgeons evaluated with plans for viability      MRI as an outpatient and further evaluation for bypass grafting. 6. Tobacco use.  The patient underwent educational tobacco cessation. 7. Hypertension, untreated prior to admission. 8. Dyslipidemia, now treated. 9. Renal insufficiency, chronic kidney disease stage III. 10.Diabetes mellitus type 2, insulin dependent. 11.Financial stressors, unable to afford recommended Januvia, will     return to insulin at discharge and will follow up with primary     care.  DISCHARGE CONDITION:  Improved.  PROCEDURES:  Combined left heart cath by Dr. Julieanne Manson October 31, 2010, right and left heart cath.  EDUCATION: 1. Diabetic and low-salt diet education, 2-gram sodium. 2. Tobacco cessation education.  CONSULTS:  With Dr. Andrey Spearman for bypass grafting with instructions reevaluation after heart failure improved and MRI viability completed.  Please note, the patient's weight at discharge as 81.4 kg down from 95.5 kg on admission.  DISCHARGE INSTRUCTIONS: 1. Increase activity  slowly.  May shower.  No lifting for 1 week.  No     driving for 1 week.  No sexual activity for 1 week. 2. No return to work until Dr. Clarene Duke clears. 3. Low-sodium, heart-healthy, diabetic diet. 4. Were LifeVest. 5. Wash cath site with soap and water.  Call if any bleeding,     swelling, or drainage. 6. Follow up with Dr. Clarene Duke on November 14, 2010, at 11 a.m. 7. Dr. Clarene Duke will have blood work done of basic metabolic panel.  HISTORY OF PRESENT ILLNESS:  A 50 year old gentleman with multiple cardiac risk factors, insulin-dependent diabetes, hypertension, hyperlipidemia, and substance abuse, presented on October 31, 2010, with shortness of breath, which is primarily with exertion and started 2 weeks prior to admission, progressive in nature, and is now having some episodes at rest, and may have had leg swelling since November.  He has been on his feet for long periods of time with a new job.  His shortness of breath became, he was gasping for air in the middle of the night, sleeping on 2 pillows.  Denied chest pain or tightness, was admitted on February 20.  His x-ray revealed congestive heart failure.  EKG with LVH.  His cardiac markers  were positive with troponin of 5, was placed on IV heparin, treated with IV diuretics and nitroglycerin, and transferred to [facility] where he was given further diuretics and planned for cardiac catheterization once his heart failure was improved.  Two-D echo was obtained.  Two-D echo revealed EF of 15-20%.  He diuresed well.  His enzymes were positive with elevated troponin, the peak was 11.42 with a CK of 327, MB of 2.9.  The patient underwent cardiac catheterization, was found to have 3-vessel coronary disease, 4 mid vessel disease, 100% occluded circumflex.  Consult was obtained with Dr. Andrey Spearman for bypass grafting.  He felt that viability scan needed to be done when patient's heart failure was resolved.  Dr. Clarene Duke prior to discharge  talk to Dr. Eden Emms about MRI viability scan, and this will be arranged through the [facility] office with Dr. Eden Emms as an outpatient, and patient will be referred back to Dr. Dorris Fetch as needed.  The patient continued to diurese.  By February 29, he had ambulated with cardiac rehab and did quite well.  Nutritionist had talked about 2-gram sodium diet.  His blood pressure remained more elevated, so his lisinopril has been increased to 20 twice a day.  He does have some renal insufficiency, we will need to monitor his creatinine.  He continued to diurese well on 40 of Lasix b.i.d. with negative 4-5 liters.  Weight dropped significantly and was cut back to 40 mg at discharge.  Due to the patient's low EF 15-20%, his ischemia and his nonsustained ventricular tachycardia, he is at high risk for sudden cardiac death syndrome and we requested LifeVest, which was approved, and the patient will be discharged with a LifeVest in place.  Please note during hospitalization, [facility] assisted as patient did not have primary care with his diabetes and he was placed on Januvia which he did excellent job.  Unfortunately as an outpatient, this medication will cost the patient 235 dollars.  He will be on several medications that will be 4 dollars.  The patient states he cannot afford the Januvia.  Glycohemoglobin was 8.1.  We would like it somewhat tighter.  We will leave him on 70/30 of insulin and have him followup with the primary care.  We have asked [facility] to see him, and they instructed him to come to the center to pick up the packet from [facility] at [facility] to establish eligibility or pay 60-dollar copay.  The patient was given that information.  Also, we will write 3-day supply prescription at discharge and will have [facility] manage her heart failure management.  LABORATORY DATA:  Blood gases on room air, pH 7.540, pCO2 36.9, pO2 70; pH  7.462, pCO2 43.9, pO2 27.  Hemoglobin at discharge 13.8, hematocrit 43.8, WBC 7.1, and platelets 269.  Chemistry:  Sodium 136, potassium 4.3, chloride 99, CO2 30, glucose 104, BUN 21, creatinine 1.57, total bili 0.9, direct bili 0.3, indirect bili 0.6, alkaline phos 130, SGOT 25, SGPT 13, total protein 6.1, albumin 2.4, and calcium 9.1, magnesium was 2.3.  Accu-Cheks ranged from 243 to 109.  BNP on admission was 1582 and gradually decreased during the hospitalization, and at discharge is 727. Cholesterol panel:  Total cholesterol 166, triglycerides 66, HDL 33, LDL 120.  His drug screen was positive for marijuana.  MRSA screen was negative.  Chest x-ray on admission, bilateral lower lobe opacity suspicious for pneumonia, right pleural effusion with associated right lower lobe atelectasis, mild  cardiomegaly.  Followup on the 22nd, atelectasis noted at the left base, worsening aeration to the right lung base, bilateral pleural effusions, and on the 27th chest x-ray improved aeration at the lung bases, small bilateral pleural effusions without appreciable change.  VITAL SIGNS:  At discharge, blood pressure was 157/82, pulse 60, respiratory rate 20, temperature 97.9, oxygen saturation room air was 98%. HEART:  Regular rate and rhythm. LUNGS:  He had a few crackles in his left base. ABDOMEN:  Positive bowel sounds. EXTREMITIES: No edema, which he had 2+ pitting edema on admission, and this was entirely gone.  The patient has not had any further ventricular tachycardia.  EKGs, sinus rhythm, biatrial enlargement, anterior infarction.  Two-D echo on October 29, 2010, EF 15-20%, moderate diffuse hypokinesis with severe hypokinesis of the basal mid inferoseptal myocardium.  There is severe hypokinesis in the entire anterior myocardium, mild mitral regurgitation.  On carotid Dopplers, no evidence of ICA stenosis.  DISCHARGE MEDICATION:  See medication reconciliation sheet.  DISCHARGE  INSTRUCTIONS:  As previously stated.     [name]. [name], N.P.   ______________________________ [name], M.D.    LRI/MEDQ  D:  11/06/2010  T:  11/07/2010  Job:  [id number]  cc:   Thereasa Solo. Little, M.D.  Electronically Signed by Nada Boozer N.P. on 11/07/2010 04:54:09 AM Electronically Signed by [name] M.D. on 11/20/2010 01:57:42 PM

## 2010-11-22 ENCOUNTER — Other Ambulatory Visit: Payer: Self-pay | Admitting: Cardiology

## 2010-11-22 DIAGNOSIS — R9430 Abnormal result of cardiovascular function study, unspecified: Secondary | ICD-10-CM

## 2010-11-22 DIAGNOSIS — IMO0002 Reserved for concepts with insufficient information to code with codable children: Secondary | ICD-10-CM

## 2010-11-25 ENCOUNTER — Ambulatory Visit: Payer: Self-pay

## 2010-12-02 ENCOUNTER — Ambulatory Visit (HOSPITAL_COMMUNITY)
Admission: RE | Admit: 2010-12-02 | Discharge: 2010-12-02 | Disposition: A | Discharge status: Home / Self Care | Payer: Self-pay | Source: Ambulatory Visit | Attending: Cardiology | Admitting: Cardiology

## 2010-12-02 DIAGNOSIS — I2589 Other forms of chronic ischemic heart disease: Secondary | ICD-10-CM

## 2010-12-02 DIAGNOSIS — I517 Cardiomegaly: Secondary | ICD-10-CM | POA: Insufficient documentation

## 2010-12-02 DIAGNOSIS — R9430 Abnormal result of cardiovascular function study, unspecified: Secondary | ICD-10-CM

## 2010-12-02 MED ORDER — GADOPENTETATE DIMEGLUMINE 469.01 MG/ML IV SOLN
40.0000 mL | Freq: Once | INTRAVENOUS | Status: AC
  Administered 2010-12-02: 40 mL via INTRAVENOUS

## 2010-12-10 ENCOUNTER — Encounter: Payer: Self-pay | Attending: Cardiovascular Disease | Admitting: Dietician

## 2010-12-19 ENCOUNTER — Encounter (INDEPENDENT_AMBULATORY_CARE_PROVIDER_SITE_OTHER): Payer: Self-pay | Admitting: Thoracic Surgery (Cardiothoracic Vascular Surgery)

## 2010-12-19 DIAGNOSIS — I2510 Atherosclerotic heart disease of native coronary artery without angina pectoris: Secondary | ICD-10-CM

## 2010-12-20 NOTE — H&P (Signed)
HISTORY AND PHYSICAL EXAMINATION  December 19, 2010  Re:  [name], [name]          DOB:  [birth year]  HISTORY OF PRESENT ILLNESS:  The patient is a 50 year old gentleman who was admitted in February 2012 at [facility] with acute systolic and diastolic heart failure.  This was a new diagnosis.  His workup revealed severe left ventricular dysfunction with ejection fraction estimated at 50% by echocardiogram.  He was in severe failure with a BNP of greater than 1000.  He had elevated troponins and ruled in for non-ST elevation MI.  He also had some nonsustained ventricular tachycardia while in the hospital.  He had elevated filling pressures on right heart catheterization.  His cardiac output and index of less than 1.7 by Fick and thermodilution.  He had three-vessel coronary disease.  The LAD was diffusely diseased.  There was 80% stenosis and a large ramus.  His circumflex was totally occluded.  His right coronary gave off multiple posterior descending posterolateral branches all of which were diseased but relatively small.  He was not a candidate for surgery at that time and the plan was to treat him medically and then reassess his cardiac function and assess viability.  That was done with a MRI on December 02, 2010 which showed left ventricular hypertrophy.  There was subendocardial scar in the anterior and septal walls.  Ejection fraction was estimated 42% quantitatively but appeared to be 30-35% qualitatively.  The patient states that since his discharge from the hospital, he has not been having any problem with shortness of breath or wheezing.  He does gets swelling in his legs, left greater than right. It comes and goes.  He has not had orthopnea or paroxysmal nocturnal dyspnea.  He says his blood sugars have been well controlled with sugars that are less than 200.  PAST MEDICAL HISTORY: 1. Acute systolic and diastolic congestive heart failure. 2.  Non-ST-elevation MI. 3. Ischemic cardiomyopathy, ejection fraction of 15-20%, question     nonischemic component. 4. Nonsustained ventricular tachycardia. 5. Three-vessel coronary artery disease. 6. Tobacco abuse, quit after hospitalization in February. 7. Hypertension. 8. Dyslipidemia. 9. Chronic kidney disease, stage III. 10.Type 2 insulin-dependent diabetes.  CURRENT MEDICATIONS: 1. Lisinopril 20 mg p.o. b.i.d. 2. Lasix 40 mg daily. 3. Amlodipine 10 mg daily. 4. Digoxin 0.125 mg daily. 5. Aspirin 325 mg daily. 6. Coreg 12.5 mg b.i.d. 7. Simvastatin 40 mg daily. 8. Spironolactone 12.5 mg b.i.d. 9. Imdur 60 mg daily. 10.Novolin insulin 45 units subcu q.a.m. and 35 units subcu q.p.m. 11.Sublingual nitroglycerin p.r.n. which he has not been having to     take.  ALLERGIES:  He has no known drug allergies.  FAMILY HISTORY:  Significant for hypertension.  SOCIAL HISTORY:  The patient is separated.  He currently lives with his mother.  She is 81 and in good health.  He works at [employer].  Does occasionally use marijuana.  He drinks socially.  He denies any current tobacco abuse.  REVIEW OF SYSTEMS:  As noted in HPI, otherwise all systems are negative.  PHYSICAL EXAMINATION:  General:  The patient is a 50 year old gentleman in no acute distress.  He is well developed and well nourished.  Vital Signs:  Blood pressure 139/85, pulse 67, respirations 28, and ox saturation is 98% on room air.  Neurologic:  He is alert, oriented x3 with no focal deficits.  HEENT:  Unremarkable.  Neck:  Supple without thyromegaly, adenopathy, or bruits.  Cardiac:  Regular rate and rhythm. Normal S1-S2.  There are no rubs or murmurs.  Lungs:  Clear with equal breath sounds bilaterally, although somewhat diminished in both bases. Abdomen:  Soft and nontender.  Extremities:  Chronic venous stasis changes in the left lower leg.  There is some edema but without the chronic changes present in the  right leg.  Distal pulses are difficult to palpate in both lower extremities.  LABORATORY DATA:  Cardiac catheterization, echocardiogram, and cardiac MI reviewed, findings as previously noted.  IMPRESSION:  The patient is a 50 year old gentleman with three-vessel coronary disease with severe left ventricular dysfunction, recently admitted with decompensated heart failure.  He is now much better compensated and has shown some improvement in left ventricular function. He does appear to have a viable myocardium by MRI.  Although his coronary disease is not ideal for coronary bypass grafting, he does have significant enough disease to warrant coronary artery bypass grafting. Given his three-vessel disease and severe left jugular dysfunction, there is both survival benefit and hopefully some improvement in his cardiac function.  I discussed in detail with the patient the indications, risks, benefits, and alternative treatments.  He understands the nature of the procedure, need for general anesthesia, incisions to be used, expected hospital stay, and overall recovery.  I discussed in detail with the patient the potential risks.  He understands the risks that include but not limited to death, stroke, myocardial infarction, deep venous thrombosis, pulmonary embolism, bleeding, possible need for transfusions, infections as well as other organ system dysfunction including respiratory, renal, hepatic, or GI complications.  He is potentially at increased risk for early and/or late graft failure due to his diffuse disease and small target vessels.  The patient understands and accepts the risks of surgery.  He agrees to proceed.  We have scheduled him for Monday, Jan 27, 2011.  He will be admitted on the day of surgery.  Salvatore Decent Dorris Fetch, M.D. Electronically Signed  SCH/MEDQ  D:  12/19/2010  T:  12/20/2010  Job:  [id number]

## 2011-01-23 ENCOUNTER — Encounter (HOSPITAL_COMMUNITY)
Admission: RE | Admit: 2011-01-23 | Discharge: 2011-01-23 | Disposition: A | Discharge status: Home / Self Care | Payer: Medicaid Other | Source: Ambulatory Visit

## 2011-01-23 ENCOUNTER — Ambulatory Visit (HOSPITAL_COMMUNITY)
Admission: RE | Admit: 2011-01-23 | Discharge: 2011-01-23 | Disposition: A | Discharge status: Home / Self Care | Payer: Medicaid Other | Source: Ambulatory Visit | Attending: Thoracic Surgery (Cardiothoracic Vascular Surgery) | Admitting: Thoracic Surgery (Cardiothoracic Vascular Surgery)

## 2011-01-23 ENCOUNTER — Other Ambulatory Visit: Payer: Self-pay | Admitting: Thoracic Surgery (Cardiothoracic Vascular Surgery)

## 2011-01-23 ENCOUNTER — Inpatient Hospital Stay (HOSPITAL_COMMUNITY)
Admission: RE | Admit: 2011-01-23 | Discharge: 2011-01-23 | Disposition: A | Discharge status: Home / Self Care | Payer: Medicaid Other | Source: Ambulatory Visit | Attending: Thoracic Surgery (Cardiothoracic Vascular Surgery) | Admitting: Thoracic Surgery (Cardiothoracic Vascular Surgery)

## 2011-01-23 DIAGNOSIS — Z01810 Encounter for preprocedural cardiovascular examination: Secondary | ICD-10-CM | POA: Insufficient documentation

## 2011-01-23 DIAGNOSIS — I2510 Atherosclerotic heart disease of native coronary artery without angina pectoris: Secondary | ICD-10-CM

## 2011-01-23 DIAGNOSIS — Z01812 Encounter for preprocedural laboratory examination: Secondary | ICD-10-CM | POA: Insufficient documentation

## 2011-01-23 LAB — COMPREHENSIVE METABOLIC PANEL
ALT: 13 U/L (ref 0–53)
AST: 20 U/L (ref 0–37)
Albumin: 3.6 g/dL (ref 3.5–5.2)
Calcium: 9.5 mg/dL (ref 8.4–10.5)
GFR calc Af Amer: >60 mL/min (ref >60)
Sodium: 139 mEq/L (ref 135–145)
Total Protein: 7.5 g/dL (ref 6.0–8.3)

## 2011-01-23 LAB — HEMOGLOBIN A1C
Hgb A1c MFr Bld: 9.4 % — ABNORMAL HIGH (ref <5.7)
Mean Plasma Glucose: 223 mg/dL — ABNORMAL HIGH (ref <117)

## 2011-01-23 LAB — URINALYSIS, ROUTINE W REFLEX MICROSCOPIC
Ketones, ur: NEGATIVE mg/dL
Leukocytes, UA: NEGATIVE
Nitrite: NEGATIVE
Specific Gravity, Urine: 1.023 (ref 1.005–1.030)
Urobilinogen, UA: 1 mg/dL (ref 0.0–1.0)

## 2011-01-23 LAB — BLOOD GAS, ARTERIAL
Acid-Base Excess: 3.6 mmol/L — ABNORMAL HIGH (ref 0.0–2.0)
TCO2: 28.6 mmol/L (ref 0–100)
pCO2 arterial: 40 mmHg (ref 35.0–45.0)
pH, Arterial: 7.45 (ref 7.350–7.450)
pO2, Arterial: 91.2 mmHg (ref 80.0–100.0)

## 2011-01-23 LAB — TYPE AND SCREEN
ABO/RH(D): A POS
Antibody Screen: NEGATIVE

## 2011-01-23 LAB — CBC
HCT: 40 % (ref 39.0–52.0)
Platelets: 184 10*3/uL (ref 150–400)
RBC: 4.99 MIL/uL (ref 4.22–5.81)
RDW: 16.4 % — ABNORMAL HIGH (ref 11.5–15.5)
WBC: 8.3 10*3/uL (ref 4.0–10.5)

## 2011-01-23 LAB — PROTIME-INR: INR: 0.99 (ref 0.00–1.49)

## 2011-01-23 LAB — APTT: aPTT: 30 seconds (ref 24–37)

## 2011-01-27 ENCOUNTER — Inpatient Hospital Stay (HOSPITAL_COMMUNITY)
Admission: RE | Admit: 2011-01-27 | Discharge: 2011-02-01 | DRG: 236 | Disposition: A | Discharge status: Home / Self Care | Payer: Medicaid Other | Source: Ambulatory Visit | Attending: Thoracic Surgery (Cardiothoracic Vascular Surgery) | Admitting: Thoracic Surgery (Cardiothoracic Vascular Surgery)

## 2011-01-27 ENCOUNTER — Inpatient Hospital Stay (HOSPITAL_COMMUNITY): Payer: Medicaid Other

## 2011-01-27 DIAGNOSIS — D696 Thrombocytopenia, unspecified: Secondary | ICD-10-CM | POA: Diagnosis not present

## 2011-01-27 DIAGNOSIS — IMO0002 Reserved for concepts with insufficient information to code with codable children: Secondary | ICD-10-CM | POA: Diagnosis not present

## 2011-01-27 DIAGNOSIS — E8779 Other fluid overload: Secondary | ICD-10-CM | POA: Diagnosis not present

## 2011-01-27 DIAGNOSIS — I2510 Atherosclerotic heart disease of native coronary artery without angina pectoris: Secondary | ICD-10-CM

## 2011-01-27 DIAGNOSIS — I129 Hypertensive chronic kidney disease with stage 1 through stage 4 chronic kidney disease, or unspecified chronic kidney disease: Secondary | ICD-10-CM | POA: Diagnosis present

## 2011-01-27 DIAGNOSIS — N289 Disorder of kidney and ureter, unspecified: Secondary | ICD-10-CM | POA: Diagnosis not present

## 2011-01-27 DIAGNOSIS — E1169 Type 2 diabetes mellitus with other specified complication: Secondary | ICD-10-CM | POA: Diagnosis not present

## 2011-01-27 DIAGNOSIS — D62 Acute posthemorrhagic anemia: Secondary | ICD-10-CM | POA: Diagnosis not present

## 2011-01-27 DIAGNOSIS — I2589 Other forms of chronic ischemic heart disease: Secondary | ICD-10-CM | POA: Diagnosis present

## 2011-01-27 DIAGNOSIS — N9989 Other postprocedural complications and disorders of genitourinary system: Secondary | ICD-10-CM | POA: Diagnosis not present

## 2011-01-27 DIAGNOSIS — E785 Hyperlipidemia, unspecified: Secondary | ICD-10-CM | POA: Diagnosis present

## 2011-01-27 DIAGNOSIS — I252 Old myocardial infarction: Secondary | ICD-10-CM

## 2011-01-27 DIAGNOSIS — Z794 Long term (current) use of insulin: Secondary | ICD-10-CM

## 2011-01-27 DIAGNOSIS — N183 Chronic kidney disease, stage 3 unspecified: Secondary | ICD-10-CM | POA: Diagnosis present

## 2011-01-27 DIAGNOSIS — I5040 Unspecified combined systolic (congestive) and diastolic (congestive) heart failure: Secondary | ICD-10-CM | POA: Diagnosis present

## 2011-01-27 DIAGNOSIS — Z87891 Personal history of nicotine dependence: Secondary | ICD-10-CM

## 2011-01-27 DIAGNOSIS — Y832 Surgical operation with anastomosis, bypass or graft as the cause of abnormal reaction of the patient, or of later complication, without mention of misadventure at the time of the procedure: Secondary | ICD-10-CM | POA: Diagnosis not present

## 2011-01-27 LAB — CBC
MCH: 26.6 pg (ref 26.0–34.0)
MCHC: 33.6 g/dL (ref 30.0–36.0)
MCHC: 33.4 g/dL (ref 30.0–36.0)
MCV: 79.4 fL (ref 78.0–100.0)
Platelets: 126 10*3/uL — ABNORMAL LOW (ref 150–400)
Platelets: 152 10*3/uL (ref 150–400)
RBC: 3.72 MIL/uL — ABNORMAL LOW (ref 4.22–5.81)
RDW: 16.6 % — ABNORMAL HIGH (ref 11.5–15.5)
RDW: 16.7 % — ABNORMAL HIGH (ref 11.5–15.5)
WBC: 13.9 10*3/uL — ABNORMAL HIGH (ref 4.0–10.5)

## 2011-01-27 LAB — POCT I-STAT 3, ART BLOOD GAS (G3+)
Acid-base deficit: 1 mmol/L (ref 0.0–2.0)
Bicarbonate: 26 mEq/L — ABNORMAL HIGH (ref 20.0–24.0)
Bicarbonate: 23.9 mEq/L (ref 20.0–24.0)
O2 Saturation: 100 %
O2 Saturation: 100 %
O2 Saturation: 98 %
Patient temperature: 36.4
Patient temperature: 37.4
TCO2: 25 mmol/L (ref 0–100)
pCO2 arterial: 38.4 mmHg (ref 35.0–45.0)
pO2, Arterial: 307 mmHg — ABNORMAL HIGH (ref 80.0–100.0)

## 2011-01-27 LAB — POCT I-STAT, CHEM 8
Calcium, Ion: 1.18 mmol/L (ref 1.12–1.32)
Glucose, Bld: 170 mg/dL — ABNORMAL HIGH (ref 70–99)
HCT: 29 % — ABNORMAL LOW (ref 39.0–52.0)
TCO2: 25 mmol/L (ref 0–100)

## 2011-01-27 LAB — POCT I-STAT 4, (NA,K, GLUC, HGB,HCT)
Glucose, Bld: 277 mg/dL — ABNORMAL HIGH (ref 70–99)
Glucose, Bld: 83 mg/dL (ref 70–99)
Glucose, Bld: 129 mg/dL — ABNORMAL HIGH (ref 70–99)
HCT: 26 % — ABNORMAL LOW (ref 39.0–52.0)
HCT: 29 % — ABNORMAL LOW (ref 39.0–52.0)
Hemoglobin: 11.2 g/dL — ABNORMAL LOW (ref 13.0–17.0)
Hemoglobin: 8.8 g/dL — ABNORMAL LOW (ref 13.0–17.0)
Potassium: 3.6 mEq/L (ref 3.5–5.1)
Potassium: 3.6 mEq/L (ref 3.5–5.1)
Potassium: 3.8 mEq/L (ref 3.5–5.1)
Sodium: 140 mEq/L (ref 135–145)
Sodium: 141 mEq/L (ref 135–145)

## 2011-01-27 LAB — HEMOGLOBIN AND HEMATOCRIT, BLOOD
HCT: 26.8 % — ABNORMAL LOW (ref 39.0–52.0)
Hemoglobin: 8.9 g/dL — ABNORMAL LOW (ref 13.0–17.0)

## 2011-01-27 LAB — CREATININE, SERUM
Creatinine, Ser: 1.33 mg/dL (ref 0.4–1.5)
GFR calc non Af Amer: 56 mL/min — ABNORMAL LOW (ref >60)

## 2011-01-27 LAB — GLUCOSE, CAPILLARY: Glucose-Capillary: 293 mg/dL — ABNORMAL HIGH (ref 70–99)

## 2011-01-27 LAB — PROTIME-INR: Prothrombin Time: 16.8 seconds — ABNORMAL HIGH (ref 11.6–15.2)

## 2011-01-28 ENCOUNTER — Inpatient Hospital Stay (HOSPITAL_COMMUNITY): Payer: Medicaid Other

## 2011-01-28 LAB — GLUCOSE, CAPILLARY
Glucose-Capillary: 105 mg/dL — ABNORMAL HIGH (ref 70–99)
Glucose-Capillary: 115 mg/dL — ABNORMAL HIGH (ref 70–99)
Glucose-Capillary: 116 mg/dL — ABNORMAL HIGH (ref 70–99)
Glucose-Capillary: 120 mg/dL — ABNORMAL HIGH (ref 70–99)
Glucose-Capillary: 124 mg/dL — ABNORMAL HIGH (ref 70–99)
Glucose-Capillary: 126 mg/dL — ABNORMAL HIGH (ref 70–99)
Glucose-Capillary: 131 mg/dL — ABNORMAL HIGH (ref 70–99)
Glucose-Capillary: 132 mg/dL — ABNORMAL HIGH (ref 70–99)
Glucose-Capillary: 158 mg/dL — ABNORMAL HIGH (ref 70–99)
Glucose-Capillary: 96 mg/dL (ref 70–99)
Glucose-Capillary: 105 mg/dL — ABNORMAL HIGH (ref 70–99)
Glucose-Capillary: 131 mg/dL — ABNORMAL HIGH (ref 70–99)
Glucose-Capillary: 109 mg/dL — ABNORMAL HIGH (ref 70–99)
Glucose-Capillary: 128 mg/dL — ABNORMAL HIGH (ref 70–99)

## 2011-01-28 LAB — BASIC METABOLIC PANEL
BUN: 19 mg/dL (ref 6–23)
CO2: 26 mEq/L (ref 19–32)
Calcium: 8.6 mg/dL (ref 8.4–10.5)
Chloride: 107 mEq/L (ref 96–112)
Creatinine, Ser: 1.66 mg/dL — ABNORMAL HIGH (ref 0.4–1.5)
GFR calc Af Amer: 53 mL/min — ABNORMAL LOW (ref >60)

## 2011-01-28 LAB — CBC
Hemoglobin: 9.3 g/dL — ABNORMAL LOW (ref 13.0–17.0)
MCH: 26 pg (ref 26.0–34.0)
MCHC: 32.6 g/dL (ref 30.0–36.0)
MCV: 79.6 fL (ref 78.0–100.0)

## 2011-01-29 ENCOUNTER — Inpatient Hospital Stay (HOSPITAL_COMMUNITY): Payer: Medicaid Other

## 2011-01-29 LAB — BASIC METABOLIC PANEL
Calcium: 8.7 mg/dL (ref 8.4–10.5)
Creatinine, Ser: 1.53 mg/dL — ABNORMAL HIGH (ref 0.4–1.5)
GFR calc Af Amer: 58 mL/min — ABNORMAL LOW (ref >60)
Sodium: 141 mEq/L (ref 135–145)

## 2011-01-29 LAB — CBC
Hemoglobin: 9 g/dL — ABNORMAL LOW (ref 13.0–17.0)
MCHC: 32.3 g/dL (ref 30.0–36.0)
RDW: 17.6 % — ABNORMAL HIGH (ref 11.5–15.5)
WBC: 9.5 10*3/uL (ref 4.0–10.5)

## 2011-01-29 LAB — GLUCOSE, CAPILLARY
Glucose-Capillary: 114 mg/dL — ABNORMAL HIGH (ref 70–99)
Glucose-Capillary: 131 mg/dL — ABNORMAL HIGH (ref 70–99)
Glucose-Capillary: 161 mg/dL — ABNORMAL HIGH (ref 70–99)
Glucose-Capillary: 172 mg/dL — ABNORMAL HIGH (ref 70–99)
Glucose-Capillary: 92 mg/dL (ref 70–99)

## 2011-01-30 LAB — GLUCOSE, CAPILLARY
Glucose-Capillary: 51 mg/dL — ABNORMAL LOW (ref 70–99)
Glucose-Capillary: 76 mg/dL (ref 70–99)
Glucose-Capillary: 158 mg/dL — ABNORMAL HIGH (ref 70–99)

## 2011-01-30 LAB — CBC
Hemoglobin: 9 g/dL — ABNORMAL LOW (ref 13.0–17.0)
MCH: 26.7 pg (ref 26.0–34.0)
MCV: 83.1 fL (ref 78.0–100.0)
RBC: 3.37 MIL/uL — ABNORMAL LOW (ref 4.22–5.81)

## 2011-01-30 LAB — BASIC METABOLIC PANEL
BUN: 16 mg/dL (ref 6–23)
Chloride: 101 mEq/L (ref 96–112)
Glucose, Bld: 154 mg/dL — ABNORMAL HIGH (ref 70–99)
Potassium: 3.7 mEq/L (ref 3.5–5.1)

## 2011-01-31 LAB — GLUCOSE, CAPILLARY
Glucose-Capillary: 113 mg/dL — ABNORMAL HIGH (ref 70–99)
Glucose-Capillary: 115 mg/dL — ABNORMAL HIGH (ref 70–99)
Glucose-Capillary: 168 mg/dL — ABNORMAL HIGH (ref 70–99)
Glucose-Capillary: 101 mg/dL — ABNORMAL HIGH (ref 70–99)

## 2011-01-31 NOTE — Op Note (Signed)
[name], [name] NO.:  [id number]  MEDICAL RECORD NO.:  [id number]           PATIENT TYPE:  I  LOCATION:  2301                         FACILITY:  [facility]  PHYSICIAN:  Burna Forts, M.D.DATE OF BIRTH:  02/05/1961  DATE OF PROCEDURE:  01/27/2011 DATE OF DISCHARGE:                              OPERATIVE REPORT   Intraoperative transesophageal echocardiographic report.  INDICATIONS FOR PROCEDURE:  [name] is a 50 year old gentleman who presents today for coronary artery bypass grafting.  He has been asked by the attending surgeon, Dr. Charlett Lango, to place a TEE probe for evaluation of cardiac structures and function.  The patient is brought to the holding area on the morning of surgery where under local anesthesia with sedation, pulmonary artery and right arterial lines were placed.  He has been taken to the OR for routine induction of general anesthesia, after which the TEE probe was prepared and passed oropharyngeally into the stomach, then slightly withdrawn for imaging of the cardiac structures.  PRECARDIOPULMONARY BYPASS TEE EXAM: 1. Left ventricle:  The left ventricular chamber was seen initially in     the short axis view.  There is severe concentric left ventricular     hypertrophy appreciated in all segmental wall areas.  There is     overall slight diminution of contractile pattern and ejection     fraction with an estimated EF of around 40%.  Short-axis and long-     axis views again are remarkable only for the nearly 2-cm thickness     18- to 22-mm thickness of the anterior to posterior left     ventricular walls as well as overall views in the short axis view. 2. Mitral valve:  The mitral valve was initially seen in a four-     chamber view.  Anterior-posterior leaflets are well visualized.     The apparatus appears to be normal in its structure and function.     Color Doppler reveals essentially no regurgitant flow.  Multiple    views were obtained.  Again, this is a mitral valve apparatus. 3. Aortic valve.  This is seen initially in a short axis view with     three cusps easily apparent.  All mobile and opening appropriately     during systolic contraction.  There was no aortic stenosis or     aortic insufficiency appreciated in both the short and long-axis     views. 4. Right ventricle.  Tricuspid valve and right atrium were normal     structures in their size and function.  The patient was placed on cardiopulmonary bypass.  Hypothermia was begun.  At the end of the coronary bypass procedure, the patient was rewarmed and separated from cardiopulmonary bypass with the initial attempt.  POSTCARDIOPULMONARY PULMONARY BYPASS TEE EXAMINATION: 1. Left ventricle.  The left ventricular chamber is initially seen     again in the short-axis view.  There is slightly diminished left     ventricular contractility appreciated in this early post bypass     period with time and separation of cardiopulmonary bypass.  The     contractile  pattern did appear to improved.  Overall, it reached     the level of contractility as appreciated in the prebypass.  All     segmental chamber wall areas were contractile.  Short and     long-axis views were obtained.  Again, there were no significant     changes from the prebypass period. 2. The rest of cardiac examination was as previously described without     any significant changes.  The patient was then returned to the     cardiac intensive care unit in stable condition.          ______________________________ Burna Forts, M.D.     JTM/MEDQ  D:  01/27/2011  T:  01/28/2011  Job:  [id number]  Electronically Signed by Ester Rink M.D. on 01/31/2011 09:28:19 AM

## 2011-02-01 LAB — BASIC METABOLIC PANEL
BUN: 14 mg/dL (ref 6–23)
GFR calc non Af Amer: >60 mL/min (ref >60)
Glucose, Bld: 162 mg/dL — ABNORMAL HIGH (ref 70–99)
Potassium: 3.4 mEq/L — ABNORMAL LOW (ref 3.5–5.1)

## 2011-02-05 NOTE — Discharge Summary (Signed)
[name], [name] NO.:  [id number]  MEDICAL RECORD NO.:  [id number]           PATIENT TYPE:  I  LOCATION:  2017                         FACILITY:  [facility]  PHYSICIAN:  Salvatore Decent. Dorris Fetch, M.D.DATE OF BIRTH:  1960-10-11  DATE OF ADMISSION:  01/27/2011 DATE OF DISCHARGE:                              DISCHARGE SUMMARY   ADMITTING DIAGNOSES: 1. Newly diagnosed acute systolic and diastolic heart failure. 2. Coronary artery disease (with an ejection fraction of 20%). 3. History of non-ST segment elevation myocardial infarction. 4. History of insulin-dependent diabetes mellitus. 5. History of hypertension. 6. History of hyperlipidemia. 7. History of substance abuse. 8. History of ischemic cardiomyopathy (questionable nonischemic     component). 9. History of nonsustained ventricular tachycardia. 10.History of tobacco abuse (quit in February). 11.History of chronic kidney disease (stage III).  DISCHARGE DIAGNOSES: 1. Newly diagnosed acute systolic and diastolic heart failure. 2. Coronary artery disease (with an ejection fraction of 20%). 3. History of non-ST segment elevation myocardial infarction. 4. History of insulin-dependent diabetes mellitus. 5. History of hypertension. 6. History of hyperlipidemia. 7. History of substance abuse. 8. History of ischemic cardiomyopathy (questionable nonischemic     component). 9. History of nonsustained ventricular tachycardia. 10.History of tobacco abuse (quit in February). 11.History of chronic kidney disease (stage III). 12.Acute blood loss anemia. 13.Thrombocytopenia.  PROCEDURE: 1. CABG x4 in (LIMA to LAD, SVG to ramus intermediate, SVG to obtuse     marginal 1, SVG to posterior descending coronary artery) with EVH     from the right thigh and cath by Dr. Dorris Fetch on Jan 27, 2011. 2. Intraoperative TEE done by Dr. Luiz Blare on Jan 27, 2011.  HISTORY OF PRESENTING ILLNESS:  This is a 50 year old African  American male with the aforementioned past medical history who initially back in February, presented with shortness of breath with exertion, which then progressed to while he was at rest.  He also later stated he did have some leg swelling since this past November as well and later developed orthopnea (required at least two pillows as well).  He was admitted most recently on October 28, 2010 with CHF (BNP 3098) as well as a non-STEMI (troponin of 11.42).  An echocardiogram done showed severe left ventricular dysfunction, moderate-to-severe left ventricular hypertrophy, EF 15-20%, severe hypokinesis of the basal midanterior septal myocardium and the entire anterior myocardium, mild MR, right ventricle was dilated with severely reduced systolic function and there was mild tricuspid regurgitation.  He then underwent a cardiac cath with Dr. Clarene Duke on October 31, 2010, which showed an occlusion of the left circumflex, the ramus intermediate was a large vessel with a mid-to- distal 80% stenosis, diffusely diseased LAD, and disease of the posterior descending coronary artery, cardiac output was 3.4 with an index of 1.59 by Fick and the LVEDP was 26.  A cardiothoracic consultation was obtained with Dr. Dorris Fetch upon that admission.  It was ultimately decided that the patient would continue with medical management for decompensated heart failure.     Doree Fudge, PA   ______________________________ Salvatore Decent Dorris Fetch, M.D.    DZ/MEDQ  D:  01/30/2011  T:  01/30/2011  Job:  [id number]  Electronically Signed by Doree Fudge PA on 01/31/2011 08:54:56 AM Electronically Signed by Charlett Lango M.D. on 02/05/2011 03:36:13 PM

## 2011-02-05 NOTE — Discharge Summary (Signed)
[name], [name] NO.:  [id number]  MEDICAL RECORD NO.:  [id number]           PATIENT TYPE:  I  LOCATION:  2017                         FACILITY:  [facility]  PHYSICIAN:  Steven Decent. Dorris Roman, M.D.DATE OF BIRTH:  07/12/1961  DATE OF ADMISSION:  01/27/2011 DATE OF DISCHARGE:                              DISCHARGE SUMMARY   ADDENDUM:  He would be followed closely as an outpatient and then ultimately would require coronary artery bypass grafting surgery.  The patient was seen in the office by Dr. Dorris Roman on December 19, 2010.  He had not been having any problems with shortness of breath or wheezing, although he did have intermittent swelling of his lower extremities.  He also did not have any further orthopnea or PND.   It should be noted he did undergo carotid duplex, carotid ultrasound which revealed no significant internal carotid artery stenoses and the ABIs revealed 0.65-0.68 on the right and 0.53-0.56 on the left (moderate reduction in inflow bilaterally). Potential risks, complications and benefits of the surgery were discussed with the patient and he agreed to proceed with surgery. The patient was admitted to [facility] on Jan 27, 2011, in order to undergo CABG x4.  BRIEF HOSPITAL COURSE STAY:  The patient was extubated without difficulty early the evening of surgery.  He initially required A pacing as well as Neo-Synephrine.  Later, however, the Neo-Synephrine neo was weaned and he was on nitroglycerin.  He was placed on a low-dose beta- blocker, which was titrated accordingly.  Swan-Ganz, A-line, chest tubes, and Foley were all removed early in his postoperative course.  He was found to have acute blood loss anemia.  His H and H went as low as 9 and 27.9.  He did not require a postoperative transfusion.  He was also found to have mild thrombocytopenia.  Postoperatively, his platelet count did go down to 116,000; however, last platelet count was up  to 141,000.  He was volume overloaded and diuresed accordingly.  As previously stated, the patient had a history of insulin-dependent diabetes mellitus.  He was weaned off his insulin drip and he was restarted on his Novolin 70/30, which was titrated accordingly to control blood sugar.  The patient was felt surgically stable for transfer from the intensive care unit to [facility] for further convalescence on Jan 28, 2011.  He continued to progress with cardiac rehab. Currently on postop day #3, he is tolerating a diet.  He has not had a bowel movement yet, but he is passing flatus.  PHYSICAL EXAMINATION:  VITAL SIGNS:  He had T-max of 99.4.  His heart rate remains in the 90s, BP 122/61, O2 sat 96-97% was on 2 liters oxygen overnight, however, is now on room air.  Preop weight 90 kg, today's weight 92.5 kg.  CBGs 173, 155 and 161. CARDIOVASCULAR:  Regular rate and rhythm. PULMONARY:  Slight decrease in the bases. ABDOMEN:  Soft, nontender, occasional bowel sounds. EXTREMITIES:  Positive lower extremity edema right greater than left. Sternal, right lower extremity wounds are clean, dry and continuing to heal.  Provided the patient remains afebrile, hemodynamically stable, in  sinus rhythm and pending morning round evaluation, he will be surgically stable for discharge on Jan 31, 2011.  Prior to the patient's discharge, epicardial pacing wires and chest tube sutures will be removed.  Current laboratory studies are as follows:  BMET done on Jan 30, 2011; potassium 2.7 which has been supplemented, sodium 138, BUN and creatinine 16 and 1.42 respectively.  CBC on this date H and H 9 and 28, white blood cell count 11,500, platelet count 141,000.  Chest x-ray done on Jan 29, 2011 showed no pneumothorax, small bilateral pleural effusions with bibasilar atelectasis left greater than right, stable cardiomegaly.  Discharge instructions include the following: 1. Diet:  Low-sodium, heart-healthy  diabetic diet. 2. Activity:  The patient may walk up steps.  He may shower.  He is     not to lift more than 10 pounds for 4 weeks, not to drive until     after 4 weeks.  He did continue with his breathing exercise daily.     He is to walk daily and increase his frequency and duration as     tolerates. 3. Wound care.  The patient is to use soap and water on his wound.  He     is to contact the office for if any wound problems arise.  FOLLOWUP APPOINTMENTS: 1. The patient needs to contact [name] office for followup     appointment in 2 weeks. 2. The patient needs to contact his medical doctor regarding further     diabetes management (hemoglobin A1c preop 9.4). 3. The patient has an appointment to see Dr. Dorris Roman on February 20, 2011 at 12:45 p.m., 45 minutes prior to this office appointment, a     chest x-ray will be obtained.  DISCHARGE MEDICATIONS AT TIME OF DICTATION: 1. Robitussin DM syrup 15 mL p.o. q.4 h. p.r.n. cough. 2. Oxycodone 5 mg one to two tablets p.o. q. 4-6 h. p.r.n. pain. 3. Coreg 6.25 mg p.o. two times daily. 4. Enteric-coated aspirin 325 mg p.o. daily. 5. Tylenol Extra Strength 500 mg two tablets p.o. q.6 h. p.o. p.r.n.     mild pain. 6. Digoxin 0.125 mg  p.o. daily. 7. Lasix 40 meters p.o. daily. 8. Novolin 70/30, the patient currently taking 34 units in the morning     and 20 units at night; however, he is to titrate back to his preop     dose of 45 units in the morning and 35 units in the evening as his     glucose continues to increase. 9. Zocor 40 mg p.o. at bedtime.     Steven Fudge, PA   ______________________________ [name], M.D.    DZ/MEDQ  D:  01/30/2011  T:  01/30/2011  Job:  [id number]  cc:   Thereasa Solo. Roman, M.D.  Electronically Signed by Steven Fudge PA on 01/31/2011 08:56:39 AM Electronically Signed by Charlett Lango M.D. on 02/05/2011 03:36:15 PM

## 2011-02-05 NOTE — Op Note (Signed)
NAMESACRAMENTO, MONDS NO.:  [id number]  MEDICAL RECORD NO.:  [id number]           PATIENT TYPE:  I  LOCATION:  2301                         FACILITY:  [facility]  PHYSICIAN:  Salvatore Decent. Dorris Fetch, M.D.DATE OF BIRTH:  05/16/1961  DATE OF PROCEDURE:  01/27/2011 DATE OF DISCHARGE:                              OPERATIVE REPORT   PREOPERATIVE DIAGNOSIS:  Severe three-vessel coronary artery disease with ischemic cardiomyopathy.  POSTOPERATIVE DIAGNOSIS:  Severe three-vessel coronary artery disease with ischemic cardiomyopathy.  PROCEDURE:  Median sternotomy, extracorporeal circulation, coronary artery bypass grafting x4 (left internal mammary artery to LAD, saphenous vein graft to ramus intermedius, saphenous vein graft to obtuse marginal 1, saphenous vein graft to posterior descending).  SURGEON:  Salvatore Decent. Dorris Fetch, MD  ASSISTANT:  Sheliah Plane, MD  SECOND ASSISTANT:  Stephanie Acre Dasovich, PAANESTHESIA:  General.  FINDINGS:  PDA poor quality.  Remaining targets fair quality, but relatively diffusely diseased coronaries, inferior adhesions, moderate left ventricular dysfunction with ejection fraction estimated at 40-45% by echocardiogram pre and postbypass.  No significant valvular pathology.  CLINICAL NOTE:  [name] is a 50 year old gentleman who was admitted in February with acute systolic and diastolic heart failure.  He was found to have severe left ventricular dysfunction and uncompensated heart failure.  He ruled in for a non-ST-elevation MI.  He was not a candidate for surgery at that time and has been treated medically since then with medical treatment.  He shown improvement with improvement of his ejection fraction.  Demonstrated on cardiac MRI, it was found to have left ventricular hypertrophy, but the maturity of his myocardium was viable.  The patient was referred for consideration for coronary artery bypass grafting for survival benefit.   The indications, risks, benefits and alternatives were discussed in detail with the patient and he understood.  There was increased risk for graft failure due to diffusely diseased target vessels.  He understood and accepted the risks of surgery and agreed to proceed.  OPERATIVE NOTE:  [name] was brought to the preop holding area on Jan 27, 2011.  There, the Anesthesia Service placed a Swan-Ganz catheter and arterial blood pressure monitoring line, and established intravenous access.  Intravenous antibiotics were administered.  The patient was taken to the operating room, anesthetized and intubated. Transesophageal echocardiography was performed by Dr. Sharee Holster. Please refer to his separately dictated note for details, but it did show left ventricular dysfunction with ejection fraction of 40-45%, improved over the patient's initial ejection fraction in February of 15- 20%.  There was no significant valvular pathology.  Foley catheter was placed by the nursing staff.  The chest, abdomen, and legs were thenprepped and draped in usual sterile fashion.  An incision was made in the medial aspect of the right leg at the level of the knee.  The greater saphenous vein was harvested from the groin to the calf endoscopically.  It was a good-quality vessel.  Simultaneously, a median sternotomy was performed in the left internal mammary artery, it was harvested using standard technique.  The mammary artery was a good- quality vessel.  A 2000 units of heparin was  administered during the vessel harvest, remainder of the full-dose heparin was given prior to opening the pericardium.  The pericardium was opened.  The ascending aorta was of normal diameter, but was relatively elongated, it was cannulated via concentric 2-0 Ethibond pledgeted pursestring sutures.  A dual stage venous cannula was placed via pursestring suture in the right atrial appendage. Cardiopulmonary bypass was instituted  and the patient was cooled to 32 degrees Celsius.  The coronary arteries were inspected and anastomotic sites were chosen.  The conduits were inspected and were cut to length. A foam pad was placed in the pericardium to insulate the arm and protect the left phrenic nerve.  A temperature probe was placed in the myocardial septum and a cardioplegic cannula was placed in the ascending aorta.  The aorta was crossclamped.  The left ventricle was emptied via the aortic root vent.  Cardiac arrest was then achieved with a combination of cold antegrade blood cardioplegia and topical iced saline.  A 1500 mL of cardioplegia was administered.  The myocardial septal temperature cooled to 10 degrees Celsius.  The following distal anastomoses were performed.  First, a reverse saphenous vein graft was placed end-to-side to the obtuse marginal 1, this vessel was the dominant lateral branch.  It arose from the totally occluded circumflex.  There was a 1.5-mm partially intramyocardial vessel that was good quality at the site of the anastomosis, but did have some plaquing distally, although one and a half probe did pass distally.  The vein graft was anastomosed end-to- side with a running 7-0 Prolene suture.  At the completion of each anastomosis, probes were passed proximally and distally to ensure patency before tying the sutures.  Cardioplegia was administered down the each vein graft at its completion to assess flow and hemostasis.  Next, a reverse saphenous vein graft was placed end-to-side to the ramus intermedius.  This was an anterolateral vessel that bifurcated just beyond the anastomosis.  There was a 90% stenosis just proximal to the anastomosis.  Vessel was fair-quality.  The vein was anastomosed end-to- side with a running 7-0 Prolene suture and there was good flow and good hemostasis at this anastomosis.  Next, the inferior wall was inspected.  The patient had a large right coronary  artery which gave rise to five distal branches, four separate branches were identified in the dissection, but only one was large enough to be bypassable even it was small poor quality 1-mm target.  The vein was anastomosed to the posterior descending with a running 7-0 Prolene suture.  Probe did pass proximally and distally and there was adequate flow through the graft.  Next, the left internal mammary artery was brought through a window in the pericardium.  The distal end was beveled.  It was anastomosed end-to- side to the distal LAD, which was a 2-mm vessel, but did have diffuse disease with that and the wall was markedly thickened.  The mammary was anastomosed end-to-side with a running 8-0 Prolene suture.  At the completion of the mammary to LAD anastomosis, the bulldog clamp was briefly removed to inspect for hemostasis.  Immediate rapid septal rewarming was noted.  The bulldog clamp was replaced.  The mammary pedicle was tacked to the epicardial surface of the heart with 6-0 Prolene sutures.  Additional cardioplegia was administered down the vein grafts.  They were then cut to length.  The cardioplegic cannula was removed from the ascending aorta and the proximal vein graft anastomoses were performed to 4.5-mm punch aortotomies with  running 6-0 Prolene sutures.  At the completion of the final proximal anastomosis, the patient was placed in Trendelenburg position, lidocaine was administered.  The bulldog clamps were again removed from the left mammary artery.  The heat and aortic root were de-aired and aortic cross-clamp was removed.  The total cross- clamp time was 79 minutes.  The patient required two defibrillations and then was in sinus bradycardia thereafter.  All proximal and distal anastomoses were inspect for hemostasis as rewarming was completed.  Epicardial pacing wires were placed on the right ventricle and right atrium, and DDD pacing was initiated.  A dopamine  infusion was initiated at 3 mcg/kg/minute.  When the patient had rewarmed to a core temperature of 37 degrees Celsius, he was weaned from cardiopulmonary bypass on the first attempt.  Total bypass time was 125 minutes.  Initial cardiac index was greater than 2 liters per minute per meter squared.  The patient remained hemodynamically stable throughout the postbypass period.  Postbypass transesophageal echocardiography revealed no change in left ventricular function.  A test dose of protamine was administered and was well tolerated.  The atrial and aortic cannulae were removed.  The remainder of the protamine was administered without incident.  The chest was irrigated with warm normal saline.  Hemostasis was achieved.  A left pleural and single mediastinal chest tubes were placed through separate subcostal incisions.  The pericardium was reapproximated over the ascending aorta and base of the heart with interrupted 3-0 silk sutures.  The sternum was closed with interrupted heavy gauge stainless steel wires.  The pectoralis fascia, subcutaneous tissue, and skin were closed in a standard fashion.  All sponge, needle, and instrument counts were correct at the end of procedure.  There were no intraoperative complications.  The patient was taken from the operating room to the surgical intensive care unit in good condition.     Salvatore Decent Dorris Fetch, M.D.     SCH/MEDQ  D:  01/27/2011  T:  01/28/2011  Job:  [id number]  cc:   Thereasa Solo. Little, M.D.  Electronically Signed by Charlett Lango M.D. on 02/05/2011 03:36:11 PM

## 2011-02-19 ENCOUNTER — Other Ambulatory Visit: Payer: Self-pay | Admitting: Thoracic Surgery (Cardiothoracic Vascular Surgery)

## 2011-02-19 DIAGNOSIS — I2510 Atherosclerotic heart disease of native coronary artery without angina pectoris: Secondary | ICD-10-CM

## 2011-02-20 ENCOUNTER — Ambulatory Visit
Admission: RE | Admit: 2011-02-20 | Discharge: 2011-02-20 | Disposition: A | Discharge status: Home / Self Care | Payer: No Typology Code available for payment source | Source: Ambulatory Visit | Attending: Thoracic Surgery (Cardiothoracic Vascular Surgery) | Admitting: Thoracic Surgery (Cardiothoracic Vascular Surgery)

## 2011-02-20 ENCOUNTER — Encounter (INDEPENDENT_AMBULATORY_CARE_PROVIDER_SITE_OTHER): Payer: Self-pay | Admitting: Thoracic Surgery (Cardiothoracic Vascular Surgery)

## 2011-02-20 DIAGNOSIS — I2510 Atherosclerotic heart disease of native coronary artery without angina pectoris: Secondary | ICD-10-CM

## 2011-02-21 NOTE — Assessment & Plan Note (Signed)
OFFICE VISIT  [name], [name] DOB:  27-Jan-1961                                        February 20, 2011 CHART #:  [id number]  The patient is a 50 year old gentleman who had presented back in the winter with severe decompensated congestive heart failure.  He was treated medically initially.  He did have 3-vessel disease.  Dr. Clarene Duke repeated his echocardiogram, which showed marked improvement of his ejection fraction from about 20% up to 40-45%.  He underwent coronary bypass grafting x4 on Jan 27, 2011.  Postoperative course was remarkable for some hypoglycemia which was relatively difficult to control, but ultimately he was discharged without any significant complications.  He states he has been doing well.  He is still having some pain.  He is still taking oxycodone about 3 times a day.  This is mostly on the left side, really irritates him in the shirt, rubs on the skin, sometimes will have sharp pains.  He has not had any anginal-type pain.  He did not have any medication changes since he left the hospital.  He states that he was little anxious as his ride was late and then he was delayed in radiology and he is in pain because he ran out of the pain pills, but overall he is happy with how he is doing.  CURRENT MEDICATIONS: 1. Lisinopril 10 mg daily. 2. Lasix 40 mg daily. 3. Digoxin 0.125 mg daily. 4. Aspirin 325 mg daily. 5. Coreg 6.25 mg b.i.d. 6. Simvastatin 40 mg daily. 7. Spironolactone 12.5 mg b.i.d. 8. Novolin 70/30 45 units in the morning, 35 units at night. 9. Oxycodone about 3 times a day 5 mg p.r.n. for pain.  PHYSICAL EXAMINATION:  Today, the patient is a 50 year old gentleman, in no acute distress.  His blood pressure is 170/105, pulse 78, respirations are 18, his oxygen saturation is 99% on room air.  Lungs are clear with equal breath sounds bilaterally.  His cardiac exam has regular rate and rhythm.  Normal S1 and S2.  No murmurs, rubs,  or gallops.  His sternum is stable.  Sternal incision is clean, dry, and intact.  Leg incisions are healing well.  He has trace-to-no peripheral edema, although he does have chronic venous stasis changes in his lower extremities.  IMPRESSION:  The patient is doing surprisingly well, really happy with his progress overall.  His blood pressure is high today.  We had a lot of trouble with low blood pressure initially after the surgery.  He states that he is having some pain and some frustration and thinks that maybe why it is elevated.  He has an appointment to see Dr. Clarene Duke in the next week or 2.  I am going to not change his blood pressure medications at this time, but I suspect he will need to go back on our increased doses of medications when he sees Dr. Clarene Duke.  From a surgical standpoint, he is doing quite well.  I advised him not to lift objects weighing greater than 10 pounds for another 3 weeks and not to have lift anything over 20 pounds for 5 weeks.  He may drive on limited basis, appropriate precautions were discussed.  He will continue followup with Dr. Clarene Duke.  I would be happy to see him back if I can be of any assistance in the future.  Salvatore Decent Dorris Fetch, M.D. Electronically Signed  SCH/MEDQ  D:  02/20/2011  T:  02/21/2011  Job:  [id number]

## 2011-03-11 NOTE — Discharge Summary (Signed)
  NAME:  BERNARR, LONGSWORTH NO.:  [id number]  MEDICAL RECORD NO.:  [id number]           PATIENT TYPE:  O  LOCATION:  XRAY                         FACILITY:  [facility]  PHYSICIAN:  Salvatore Decent. Dorris Fetch, M.D.DATE OF BIRTH:  04/13/1961  DATE OF ADMISSION:  01/23/2011 DATE OF DISCHARGE:  01/23/2011                              DISCHARGE SUMMARY   ADDENDUM  This is update of the patient's discharge medication.  In addition, the patient will be going home with potassium chloride 20 mEq daily.     [name], PA   ______________________________ Salvatore Decent Dorris Fetch, M.D.    KMD/MEDQ  D:  02/01/2011  T:  02/01/2011  Job:  [id number]  Electronically Signed by Cameron Proud PA on 02/11/2011 10:01:06 AM Electronically Signed by Charlett Lango M.D. on 03/11/2011 01:45:03 PM

## 2011-03-11 NOTE — Discharge Summary (Signed)
  [name], Roman NO.:  [id number]  MEDICAL RECORD NO.:  [id number]           PATIENT TYPE:  I  LOCATION:  2041                         FACILITY:  [facility]  PHYSICIAN:  Salvatore Decent. Dorris Fetch, M.D.DATE OF BIRTH:  04/02/1961  DATE OF ADMISSION:  01/27/2011 DATE OF DISCHARGE:                              DISCHARGE SUMMARY   ADDENDUM  On the evening of Jan 30, 2011, the patient developed hypoglycemia with a blood sugar dropping to 41.  He became dizzy at that time.  He received 1 tube of glucose and blood sugar increased to 50.  The patient was eating lunch, blood sugar rechecked and increased to 76.  This was monitored.  The patient's insulin dose was adjusted to NovoLog insulin 3 units in the a.m., Humulin NPH 25 units in the a.m. with meal and 20 units in the p.m. with meal.  Blood sugars have been continued to be followed and has remained stable.  On morning rounds of Jan 31, 2011, the patient noted to have low-grade temperature of 99.1, but no signs of infection.  All incisions are clean, dry, and intact, and healing well. Lungs were clear to auscultation bilaterally.  He was noted to be in normal sinus rhythm.  Blood pressure has slowly started to increase and morning blood pressure systolic is in the 160s.  The patient's most recent creatinine was trending back down to 1.42.  We will restart the patient on low-dose lisinopril.  The patient has remained off oxygen to maintain O2 sats greater than 90% on room air.  The patient is tentatively ready for discharge home later today after evaluation by Dr. Dorris Fetch.  Please see dictated discharge summary for followup appointments and discharge instructions.  DISCHARGE MEDICATIONS: 1. Guaifenesin 15 mL q.4 hours p.r.n. cough. 2. Oxycodone 5 mg 1-2 tabs q.4-6 hours p.r.n. pain. 3. Coreg 6.25 mg b.i.d. 4. Enteric-coated aspirin 325 mg daily. 5. Lisinopril 10 mg daily. 6. Novolin 70/30, 25 units in the a.m.  with meal and 20 units in the     p.m. with meal. 7. Tylenol 500 mg 2 tablets q.6 hours p.r.n. pain. 8. Digoxin 0.125 mg daily. 9. Lasix 40 mg daily. 10.Zocor 40 mg at night.     [name], PA   ______________________________ Salvatore Decent Dorris Fetch, M.D.    KMD/MEDQ  D:  01/31/2011  T:  01/31/2011  Job:  [id number]  cc:   Thereasa Solo. Little, M.D.  Electronically Signed by Cameron Proud PA on 02/11/2011 10:01:28 AM Electronically Signed by Charlett Lango M.D. on 03/11/2011 01:45:06 PM

## 2011-03-11 NOTE — Discharge Summary (Signed)
  NAMEKAUAN, KLOOSTERMAN NO.:  [id number]  MEDICAL RECORD NO.:  [id number]           PATIENT TYPE:  I  LOCATION:  2041                         FACILITY:  [facility]  PHYSICIAN:  Salvatore Decent. Dorris Fetch, M.D.DATE OF BIRTH:  01-28-1961  DATE OF ADMISSION:  01/27/2011 DATE OF DISCHARGE:                              DISCHARGE SUMMARY   On morning of Feb 01, 2011, the patient's blood pressure noted to be systolic in the 170s over 80s.  Currently, labs are pending.  We will plan to increase the patient's lisinopril to 10 mg p.o. b.i.d. Otherwise, the patient is in normal sinus rhythm.  O2 sats greater than 98% on room air.  Temperature of 99.  Blood sugars are controlled.  We will follow up on BMP prior to discharge.  The patient will be seen by MD prior to discharge and he is tentatively ready for discharge to home today.  Please see dictated discharge summary for followup appointments and discharge instructions and discharge medications with change of the patient's lisinopril to 10 mg p.o. b.i.d.     Stephanie Acre Dasovich, PA   ______________________________ Salvatore Decent. Dorris Fetch, M.D.    KMD/MEDQ  D:  02/01/2011  T:  02/01/2011  Job:  [id number]  cc:   Thereasa Solo. Little, M.D.  Electronically Signed by Cameron Proud PA on 02/11/2011 10:01:39 AM Electronically Signed by Charlett Lango M.D. on 03/11/2011 01:45:09 PM

## 2012-07-14 ENCOUNTER — Encounter (HOSPITAL_BASED_OUTPATIENT_CLINIC_OR_DEPARTMENT_OTHER): Payer: Self-pay | Admitting: Emergency Medicine

## 2012-07-14 ENCOUNTER — Inpatient Hospital Stay (HOSPITAL_BASED_OUTPATIENT_CLINIC_OR_DEPARTMENT_OTHER)
Admission: EM | Admit: 2012-07-14 | Discharge: 2012-07-19 | DRG: 287 | Disposition: A | Discharge status: Home / Self Care | Payer: Medicaid Other | Attending: Internal Medicine | Admitting: Internal Medicine

## 2012-07-14 DIAGNOSIS — E876 Hypokalemia: Secondary | ICD-10-CM

## 2012-07-14 DIAGNOSIS — R079 Chest pain, unspecified: Secondary | ICD-10-CM | POA: Diagnosis present

## 2012-07-14 DIAGNOSIS — I129 Hypertensive chronic kidney disease with stage 1 through stage 4 chronic kidney disease, or unspecified chronic kidney disease: Secondary | ICD-10-CM | POA: Diagnosis present

## 2012-07-14 DIAGNOSIS — E119 Type 2 diabetes mellitus without complications: Secondary | ICD-10-CM | POA: Diagnosis present

## 2012-07-14 DIAGNOSIS — F3289 Other specified depressive episodes: Secondary | ICD-10-CM | POA: Diagnosis present

## 2012-07-14 DIAGNOSIS — I25810 Atherosclerosis of coronary artery bypass graft(s) without angina pectoris: Principal | ICD-10-CM | POA: Diagnosis present

## 2012-07-14 DIAGNOSIS — I252 Old myocardial infarction: Secondary | ICD-10-CM

## 2012-07-14 DIAGNOSIS — S301XXA Contusion of abdominal wall, initial encounter: Secondary | ICD-10-CM

## 2012-07-14 DIAGNOSIS — E162 Hypoglycemia, unspecified: Secondary | ICD-10-CM | POA: Clinically undetermined

## 2012-07-14 DIAGNOSIS — Y9210 Unspecified residential institution as the place of occurrence of the external cause: Secondary | ICD-10-CM | POA: Diagnosis not present

## 2012-07-14 DIAGNOSIS — I509 Heart failure, unspecified: Secondary | ICD-10-CM | POA: Diagnosis present

## 2012-07-14 DIAGNOSIS — Z794 Long term (current) use of insulin: Secondary | ICD-10-CM

## 2012-07-14 DIAGNOSIS — F411 Generalized anxiety disorder: Secondary | ICD-10-CM | POA: Diagnosis present

## 2012-07-14 DIAGNOSIS — E7800 Pure hypercholesterolemia, unspecified: Secondary | ICD-10-CM

## 2012-07-14 DIAGNOSIS — I200 Unstable angina: Secondary | ICD-10-CM | POA: Diagnosis present

## 2012-07-14 DIAGNOSIS — I2510 Atherosclerotic heart disease of native coronary artery without angina pectoris: Secondary | ICD-10-CM | POA: Diagnosis present

## 2012-07-14 DIAGNOSIS — F329 Major depressive disorder, single episode, unspecified: Secondary | ICD-10-CM | POA: Diagnosis present

## 2012-07-14 DIAGNOSIS — Z91199 Patient's noncompliance with other medical treatment and regimen due to unspecified reason: Secondary | ICD-10-CM

## 2012-07-14 DIAGNOSIS — Y840 Cardiac catheterization as the cause of abnormal reaction of the patient, or of later complication, without mention of misadventure at the time of the procedure: Secondary | ICD-10-CM | POA: Diagnosis not present

## 2012-07-14 DIAGNOSIS — I498 Other specified cardiac arrhythmias: Secondary | ICD-10-CM | POA: Diagnosis present

## 2012-07-14 DIAGNOSIS — Z951 Presence of aortocoronary bypass graft: Secondary | ICD-10-CM

## 2012-07-14 DIAGNOSIS — Z7982 Long term (current) use of aspirin: Secondary | ICD-10-CM

## 2012-07-14 DIAGNOSIS — E785 Hyperlipidemia, unspecified: Secondary | ICD-10-CM | POA: Diagnosis present

## 2012-07-14 DIAGNOSIS — N179 Acute kidney failure, unspecified: Secondary | ICD-10-CM

## 2012-07-14 DIAGNOSIS — F17200 Nicotine dependence, unspecified, uncomplicated: Secondary | ICD-10-CM | POA: Diagnosis present

## 2012-07-14 DIAGNOSIS — IMO0002 Reserved for concepts with insufficient information to code with codable children: Secondary | ICD-10-CM | POA: Diagnosis not present

## 2012-07-14 DIAGNOSIS — Z9119 Patient's noncompliance with other medical treatment and regimen: Secondary | ICD-10-CM

## 2012-07-14 DIAGNOSIS — I10 Essential (primary) hypertension: Secondary | ICD-10-CM

## 2012-07-14 DIAGNOSIS — N189 Chronic kidney disease, unspecified: Secondary | ICD-10-CM | POA: Diagnosis present

## 2012-07-14 DIAGNOSIS — I2589 Other forms of chronic ischemic heart disease: Secondary | ICD-10-CM | POA: Diagnosis present

## 2012-07-14 DIAGNOSIS — E1169 Type 2 diabetes mellitus with other specified complication: Secondary | ICD-10-CM | POA: Diagnosis present

## 2012-07-14 HISTORY — DX: Type 2 diabetes mellitus without complications: E11.9

## 2012-07-14 HISTORY — DX: Depression, unspecified: F32.A

## 2012-07-14 HISTORY — DX: Pure hypercholesterolemia, unspecified: E78.00

## 2012-07-14 HISTORY — DX: Anxiety disorder, unspecified: F41.9

## 2012-07-14 HISTORY — DX: Heart failure, unspecified: I50.9

## 2012-07-14 HISTORY — DX: Major depressive disorder, single episode, unspecified: F32.9

## 2012-07-14 HISTORY — DX: Essential (primary) hypertension: I10

## 2012-07-14 HISTORY — DX: Atherosclerotic heart disease of native coronary artery without angina pectoris: I25.10

## 2012-07-14 NOTE — ED Notes (Addendum)
Central and left sided CP that started at 4pm. Denies any hx of same.  Radiating to left jaw, neck and has left arm tingling. Pt sts he worked today and walked from [place] to [employer] because he didn't have a ride. Pt sts at this time left chest is tender to touch.

## 2012-07-14 NOTE — ED Provider Notes (Addendum)
History     CSN: [id number]  Arrival date & time 07/14/12  2334   First MD Initiated Contact with Patient 07/14/12 2350      Chief Complaint  Patient presents with  . Chest Pain    (Consider location/radiation/quality/duration/timing/severity/associated sxs/prior treatment) HPI This is a 51 year old male with a history of diabetes, hypertension, hyperlipidemia area artery disease. He is here with chest pain that began about 4 PM today while he was walking. The pain is in the left chest, it is described as dull, and was an 8/10 at its worst. It radiates to his left jaw and there is tingling of the left arm. It is about a 4/10 now. He is having some left chest soreness that is different than the chest pain; the soreness is worse when he moves his left arm or with palpation. There's been no associated dyspnea, nausea or diaphoresis. He has had diarrhea today. He is equivocal about whether the pain changes with exertion or rest.  Past Medical History  Diagnosis Date  . CHF (congestive heart failure)   . Hypertension   . Diabetes mellitus without complication   . High cholesterol   . CAD (coronary artery disease)   . Depression   . Anxiety     Past Surgical History  Procedure Date  . Cholecystectomy   . Coronary artery bypass graft     No family history on file.  History  Substance Use Topics  . Smoking status: Current Every Day Smoker -- 0.5 packs/day  . Smokeless tobacco: Never Used  . Alcohol Use: No      Review of Systems  All other systems reviewed and are negative.    Allergies  Review of patient's allergies indicates no known allergies.  Home Medications   Current Outpatient Rx  Name  Route  Sig  Dispense  Refill  . ASPIRIN 81 MG PO TABS   Oral   Take 81 mg by mouth daily.         Marland Kitchen CARVEDILOL 25 MG PO TABS   Oral   Take 25 mg by mouth 2 (two) times daily with a meal.         . DIGOXIN 0.125 MG PO TABS   Oral   Take 0.125 mg by mouth daily.        . FUROSEMIDE 40 MG PO TABS   Oral   Take 40 mg by mouth daily.         Marland Kitchen HYDRALAZINE HCL 25 MG PO TABS   Oral   Take 25 mg by mouth 2 (two) times daily.         . INSULIN ASPART PROT & ASPART (70-30) 100 UNIT/ML [place] SUSP   Subcutaneous   Inject 35 Units into the skin daily with supper.         . INSULIN ASPART PROT & ASPART (70-30) 100 UNIT/ML [place] SUSP   Subcutaneous   Inject 45 Units into the skin daily with breakfast.         . LISINOPRIL 20 MG PO TABS   Oral   Take 20 mg by mouth 2 (two) times daily.         Marland Kitchen SIMVASTATIN 40 MG PO TABS   Oral   Take 40 mg by mouth every evening.           BP 185/98  Pulse 92  Temp 97.7 F (36.5 C) (Oral)  Resp 21  Ht 6\' 2"  (1.88 m)  Wt 198 lb (  89.812 kg)  BMI 25.42 kg/m2  SpO2 98%  Physical Exam General: Well-developed, well-nourished male in no acute distress; appearance consistent with age of record HENT: normocephalic, atraumatic Eyes: pupils equal round and reactive to light; extraocular muscles intact Neck: supple Heart: regular rate and rhythm; no murmurs, rubs or gallops Lungs: clear to auscultation bilaterally Chest: Left chest tenderness which does not reproduce the pain of the chief complaint Abdomen: soft; nondistended; nontender; bowel sounds present Extremities: No deformity; full range of motion; pulses normal; no edema Neurologic: Awake, alert and oriented; motor function intact in all extremities and symmetric; no facial droop Skin: Warm and dry Psychiatric: Normal mood and affect    ED Course  Procedures (including critical care time)    MDM   Nursing notes and vitals signs, including pulse oximetry, reviewed.  Summary of this visit's results, reviewed by myself:  Labs:  Results for orders placed during the hospital encounter of 07/14/12  CBC WITH DIFFERENTIAL      Component Value Range   WBC 15.8 (*) 4.0 - 10.5 K/uL   RBC 4.54  4.22 - 5.81 MIL/uL   Hemoglobin 12.9 (*) 13.0 -  17.0 g/dL   HCT 40.9  81.1 - 91.4 %   MCV 88.5  78.0 - 100.0 fL   MCH 28.4  26.0 - 34.0 pg   MCHC 32.1  30.0 - 36.0 g/dL   RDW 78.2  95.6 - 21.3 %   Platelets 242  150 - 400 K/uL   Neutrophils Relative 72  43 - 77 %   Lymphocytes Relative 16  12 - 46 %   Monocytes Relative 10  3 - 12 %   Eosinophils Relative 2  0 - 5 %   Basophils Relative 0  0 - 1 %   Neutro Abs 11.4 (*) 1.7 - 7.7 K/uL   Lymphs Abs 2.5  0.7 - 4.0 K/uL   Monocytes Absolute 1.6 (*) 0.1 - 1.0 K/uL   Eosinophils Absolute 0.3  0.0 - 0.7 K/uL   Basophils Absolute 0.0  0.0 - 0.1 K/uL   WBC Morphology WHITE COUNT CONFIRMED ON SMEAR     Smear Review PLATELET COUNT CONFIRMED BY SMEAR    COMPREHENSIVE METABOLIC PANEL      Component Value Range   Sodium 141  135 - 145 mEq/L   Potassium 3.3 (*) 3.5 - 5.1 mEq/L   Chloride 100  96 - 112 mEq/L   CO2 27  19 - 32 mEq/L   Glucose, Bld 116 (*) 70 - 99 mg/dL   BUN 23  6 - 23 mg/dL   Creatinine, Ser 0.86 (*) 0.50 - 1.35 mg/dL   Calcium 9.3  8.4 - 57.8 mg/dL   Total Protein 7.6  6.0 - 8.3 g/dL   Albumin 3.9  3.5 - 5.2 g/dL   AST 31  0 - 37 U/L   ALT 12  0 - 53 U/L   Alkaline Phosphatase 59  39 - 117 U/L   Total Bilirubin 0.6  0.3 - 1.2 mg/dL   GFR calc non Af Amer 36 (*) >90 mL/min   GFR calc Af Amer 42 (*) >90 mL/min  TROPONIN I      Component Value Range   Troponin I <0.30  <0.30 ng/mL  DIGOXIN LEVEL      Component Value Range   Digoxin Level <0.3 (*) 0.8 - 2.0 ng/mL    Imaging Studies: Dg Chest 2 View  07/15/2012  *RADIOLOGY REPORT*  Clinical Data: Chest  pain.  Bilateral upper extremity pain.  Prior CABG.  History of hypertension and diabetes.  CHEST - 2 VIEW  Comparison: Two-view chest x-ray 02/20/2011, 01/29/2011, 07/21/2010.  Findings: Prior sternotomy for CABG.  Cardiac silhouette mildly enlarged.  Thoracic aorta mildly atherosclerotic, unchanged.  Hilar and mediastinal contours otherwise unremarkable.  Scarring in the right lower lobe, unchanged.  No new pulmonary  parenchymal abnormalities.  No pleural effusions.  Degenerative changes involving the thoracic spine with slight thoracic scoliosis convex right, unchanged.  IMPRESSION: Stable mild cardiomegaly.  No acute cardiopulmonary disease.   Original Report Authenticated By: Hulan Saas, M.D.     12:31 AM Chest pain has been relieved by sublingual nitroglycerin.   EKG Interpretation:  Date & Time: 07/14/2012 10:47AM (DST)  Rate: 102  Rhythm: sinus tachycardia  QRS Axis: left  Intervals: normal  ST/T Wave abnormalities: normal  Conduction Disutrbances:none  Narrative Interpretation: LVH; biatrial enlargement; poor wave progression  Old EKG Reviewed: Rate is faster; ectopy seen previously         Hanley Seamen, MD 07/15/12 0053  Hanley Seamen, MD 07/15/12 1610

## 2012-07-15 ENCOUNTER — Encounter (HOSPITAL_BASED_OUTPATIENT_CLINIC_OR_DEPARTMENT_OTHER): Payer: Self-pay | Admitting: Emergency Medicine

## 2012-07-15 ENCOUNTER — Emergency Department (HOSPITAL_BASED_OUTPATIENT_CLINIC_OR_DEPARTMENT_OTHER): Payer: Medicaid Other

## 2012-07-15 DIAGNOSIS — N189 Chronic kidney disease, unspecified: Secondary | ICD-10-CM | POA: Diagnosis present

## 2012-07-15 DIAGNOSIS — I2510 Atherosclerotic heart disease of native coronary artery without angina pectoris: Secondary | ICD-10-CM

## 2012-07-15 DIAGNOSIS — E119 Type 2 diabetes mellitus without complications: Secondary | ICD-10-CM | POA: Diagnosis present

## 2012-07-15 DIAGNOSIS — I10 Essential (primary) hypertension: Secondary | ICD-10-CM | POA: Diagnosis present

## 2012-07-15 DIAGNOSIS — R079 Chest pain, unspecified: Secondary | ICD-10-CM | POA: Diagnosis present

## 2012-07-15 DIAGNOSIS — Z951 Presence of aortocoronary bypass graft: Secondary | ICD-10-CM

## 2012-07-15 DIAGNOSIS — I509 Heart failure, unspecified: Secondary | ICD-10-CM

## 2012-07-15 DIAGNOSIS — E7800 Pure hypercholesterolemia, unspecified: Secondary | ICD-10-CM

## 2012-07-15 DIAGNOSIS — E162 Hypoglycemia, unspecified: Secondary | ICD-10-CM | POA: Clinically undetermined

## 2012-07-15 LAB — COMPREHENSIVE METABOLIC PANEL
Alkaline Phosphatase: 59 U/L (ref 39–117)
BUN: 23 mg/dL (ref 6–23)
CO2: 27 mEq/L (ref 19–32)
Chloride: 100 mEq/L (ref 96–112)
GFR calc Af Amer: 42 mL/min — ABNORMAL LOW (ref >90)
GFR calc non Af Amer: 36 mL/min — ABNORMAL LOW (ref >90)
Glucose, Bld: 116 mg/dL — ABNORMAL HIGH (ref 70–99)
Potassium: 3.3 mEq/L — ABNORMAL LOW (ref 3.5–5.1)
Total Bilirubin: 0.6 mg/dL (ref 0.3–1.2)

## 2012-07-15 LAB — CBC WITH DIFFERENTIAL/PLATELET
Basophils Absolute: 0 10*3/uL (ref 0.0–0.1)
Basophils Relative: 0 % (ref 0–1)
Eosinophils Relative: 2 % (ref 0–5)
HCT: 40.2 % (ref 39.0–52.0)
Lymphs Abs: 2.5 10*3/uL (ref 0.7–4.0)
MCV: 88.5 fL (ref 78.0–100.0)
Monocytes Relative: 10 % (ref 3–12)
Neutro Abs: 11.4 10*3/uL — ABNORMAL HIGH (ref 1.7–7.7)
RDW: 13.1 % (ref 11.5–15.5)
WBC: 15.8 10*3/uL — ABNORMAL HIGH (ref 4.0–10.5)

## 2012-07-15 LAB — BASIC METABOLIC PANEL
CO2: 27 mEq/L (ref 19–32)
Calcium: 8.6 mg/dL (ref 8.4–10.5)
Chloride: 102 mEq/L (ref 96–112)
Glucose, Bld: 222 mg/dL — ABNORMAL HIGH (ref 70–99)
Potassium: 3.8 mEq/L (ref 3.5–5.1)
Sodium: 139 mEq/L (ref 135–145)

## 2012-07-15 LAB — GLUCOSE, CAPILLARY
Glucose-Capillary: 194 mg/dL — ABNORMAL HIGH (ref 70–99)
Glucose-Capillary: 40 mg/dL — CL (ref 70–99)
Glucose-Capillary: 116 mg/dL — ABNORMAL HIGH (ref 70–99)

## 2012-07-15 LAB — CBC
HCT: 36.1 % — ABNORMAL LOW (ref 39.0–52.0)
Hemoglobin: 11.4 g/dL — ABNORMAL LOW (ref 13.0–17.0)
MCV: 88.7 fL (ref 78.0–100.0)
Platelets: 217 10*3/uL (ref 150–400)
RBC: 4.07 MIL/uL — ABNORMAL LOW (ref 4.22–5.81)
WBC: 14.5 10*3/uL — ABNORMAL HIGH (ref 4.0–10.5)

## 2012-07-15 LAB — TROPONIN I
Troponin I: <0.3 ng/mL (ref <0.30)
Troponin I: <0.3 ng/mL (ref <0.30)

## 2012-07-15 LAB — LIPID PANEL
LDL Cholesterol: 105 mg/dL — ABNORMAL HIGH (ref 0–99)
VLDL: 13 mg/dL (ref 0–40)

## 2012-07-15 MED ORDER — POTASSIUM CHLORIDE 20 MEQ/15ML (10%) PO LIQD
20.0000 meq | Freq: Once | ORAL | Status: AC
  Administered 2012-07-15: 20 meq via ORAL
  Filled 2012-07-15: qty 15

## 2012-07-15 MED ORDER — HYDRALAZINE HCL 20 MG/ML IJ SOLN: 10.0000 mg | Freq: Four times a day (QID) | INTRAMUSCULAR | Status: DC | PRN

## 2012-07-15 MED ORDER — INFLUENZA VIRUS VACC SPLIT PF IM SUSP
0.5000 mL | Freq: Once | INTRAMUSCULAR | Status: AC
  Administered 2012-07-15: 0.5 mL via INTRAMUSCULAR
  Filled 2012-07-15: qty 0.5

## 2012-07-15 MED ORDER — ONDANSETRON HCL 4 MG/2ML IJ SOLN: 4.0000 mg | Freq: Four times a day (QID) | INTRAMUSCULAR | Status: DC | PRN

## 2012-07-15 MED ORDER — GLUCOSE-VITAMIN C 4-6 GM-MG PO CHEW
CHEWABLE_TABLET | ORAL | Status: AC
  Filled 2012-07-15: qty 1

## 2012-07-15 MED ORDER — SODIUM CHLORIDE 0.9 % IV SOLN: 250.0000 mL | INTRAVENOUS | Status: DC | PRN

## 2012-07-15 MED ORDER — METOPROLOL TARTRATE 1 MG/ML IV SOLN
5.0000 mg | Freq: Once | INTRAVENOUS | Status: AC
  Administered 2012-07-15: 5 mg via INTRAVENOUS
  Filled 2012-07-15: qty 5

## 2012-07-15 MED ORDER — ASPIRIN 81 MG PO CHEW
324.0000 mg | CHEWABLE_TABLET | Freq: Once | ORAL | Status: AC
  Administered 2012-07-15: 324 mg via ORAL
  Filled 2012-07-15: qty 4

## 2012-07-15 MED ORDER — SIMVASTATIN 40 MG PO TABS
40.0000 mg | ORAL_TABLET | Freq: Every evening | ORAL | Status: DC
  Administered 2012-07-15 – 2012-07-16 (×2): 40 mg via ORAL
  Filled 2012-07-15 (×3): qty 1

## 2012-07-15 MED ORDER — NITROGLYCERIN 2 % TD OINT
0.5000 [in_us] | TOPICAL_OINTMENT | Freq: Four times a day (QID) | TRANSDERMAL | Status: DC
  Administered 2012-07-15 – 2012-07-17 (×6): 0.5 [in_us] via TOPICAL
  Filled 2012-07-15 (×2): qty 30

## 2012-07-15 MED ORDER — GLUCOSE 40 % PO GEL: 1.0000 | ORAL | Status: DC | PRN

## 2012-07-15 MED ORDER — DIGOXIN 0.25 MG/ML IJ SOLN
0.2500 mg | Freq: Once | INTRAMUSCULAR | Status: AC
  Administered 2012-07-15: 0.25 mg via INTRAVENOUS
  Filled 2012-07-15: qty 1

## 2012-07-15 MED ORDER — SODIUM CHLORIDE 0.9 % IJ SOLN: 3.0000 mL | Freq: Two times a day (BID) | INTRAMUSCULAR | Status: DC

## 2012-07-15 MED ORDER — GLUCOSE 40 % PO GEL
ORAL | Status: AC
  Administered 2012-07-15: 11:00:00
  Filled 2012-07-15: qty 1

## 2012-07-15 MED ORDER — DIAZEPAM 5 MG PO TABS
5.0000 mg | ORAL_TABLET | ORAL | Status: AC
  Administered 2012-07-16: 5 mg via ORAL
  Filled 2012-07-15: qty 1

## 2012-07-15 MED ORDER — INSULIN ASPART PROT & ASPART (70-30 MIX) 100 UNIT/ML ~~LOC~~ SUSP: 20.0000 [IU] | Freq: Every day | SUBCUTANEOUS | Status: DC

## 2012-07-15 MED ORDER — SODIUM CHLORIDE 0.9 % IJ SOLN
3.0000 mL | Freq: Two times a day (BID) | INTRAMUSCULAR | Status: DC
  Administered 2012-07-15 – 2012-07-19 (×7): 3 mL via INTRAVENOUS

## 2012-07-15 MED ORDER — SODIUM CHLORIDE 0.9 % IV SOLN: INTRAVENOUS | Status: DC

## 2012-07-15 MED ORDER — NITROGLYCERIN 2 % TD OINT
1.0000 [in_us] | TOPICAL_OINTMENT | Freq: Once | TRANSDERMAL | Status: AC
  Administered 2012-07-15: 1 [in_us] via TOPICAL

## 2012-07-15 MED ORDER — ASPIRIN 81 MG PO CHEW
324.0000 mg | CHEWABLE_TABLET | ORAL | Status: AC
  Administered 2012-07-16: 324 mg via ORAL

## 2012-07-15 MED ORDER — ASPIRIN 81 MG PO CHEW
81.0000 mg | CHEWABLE_TABLET | Freq: Every day | ORAL | Status: DC
  Administered 2012-07-15 – 2012-07-19 (×4): 81 mg via ORAL
  Filled 2012-07-15 (×5): qty 1

## 2012-07-15 MED ORDER — NITROGLYCERIN 0.4 MG SL SUBL
0.4000 mg | SUBLINGUAL_TABLET | SUBLINGUAL | Status: AC | PRN
  Administered 2012-07-15 (×3): 0.4 mg via SUBLINGUAL
  Filled 2012-07-15: qty 25

## 2012-07-15 MED ORDER — DEXTROSE 50 % IV SOLN
1.0000 | Freq: Once | INTRAVENOUS | Status: AC
  Administered 2012-07-15: 50 mL via INTRAVENOUS
  Filled 2012-07-15: qty 50

## 2012-07-15 MED ORDER — INSULIN ASPART PROT & ASPART (70-30 MIX) 100 UNIT/ML ~~LOC~~ SUSP
45.0000 [IU] | Freq: Every day | SUBCUTANEOUS | Status: DC
  Administered 2012-07-15: 45 [IU] via SUBCUTANEOUS
  Filled 2012-07-15: qty 3

## 2012-07-15 MED ORDER — ASPIRIN 300 MG RE SUPP
300.0000 mg | RECTAL | Status: AC
  Filled 2012-07-15: qty 1

## 2012-07-15 MED ORDER — INSULIN ASPART PROT & ASPART (70-30 MIX) 100 UNIT/ML ~~LOC~~ SUSP
18.0000 [IU] | Freq: Every day | SUBCUTANEOUS | Status: DC
  Filled 2012-07-15: qty 3

## 2012-07-15 MED ORDER — DEXTROSE 50 % IV SOLN
INTRAVENOUS | Status: AC
  Filled 2012-07-15: qty 50

## 2012-07-15 MED ORDER — ENOXAPARIN SODIUM 100 MG/ML ~~LOC~~ SOLN
90.0000 mg | Freq: Two times a day (BID) | SUBCUTANEOUS | Status: DC
  Administered 2012-07-15 – 2012-07-19 (×8): 90 mg via SUBCUTANEOUS
  Filled 2012-07-15 (×11): qty 1

## 2012-07-15 MED ORDER — POTASSIUM CHLORIDE CRYS ER 20 MEQ PO TBCR
40.0000 meq | EXTENDED_RELEASE_TABLET | Freq: Once | ORAL | Status: AC
  Administered 2012-07-15: 40 meq via ORAL
  Filled 2012-07-15: qty 2

## 2012-07-15 MED ORDER — LISINOPRIL 20 MG PO TABS
20.0000 mg | ORAL_TABLET | Freq: Two times a day (BID) | ORAL | Status: DC
  Administered 2012-07-15 – 2012-07-19 (×9): 20 mg via ORAL
  Filled 2012-07-15 (×11): qty 1

## 2012-07-15 MED ORDER — DEXTROSE 50 % IV SOLN: 50.0000 mL | Freq: Once | INTRAVENOUS | Status: AC | PRN

## 2012-07-15 MED ORDER — DIAZEPAM 5 MG PO TABS: 5.0000 mg | ORAL_TABLET | ORAL | Status: DC

## 2012-07-15 MED ORDER — SODIUM CHLORIDE 0.9 % IV SOLN
1.0000 mL/kg/h | INTRAVENOUS | Status: DC
  Administered 2012-07-15: 1 mL/kg/h via INTRAVENOUS

## 2012-07-15 MED ORDER — SODIUM CHLORIDE 0.9 % IJ SOLN: 3.0000 mL | INTRAMUSCULAR | Status: DC | PRN

## 2012-07-15 MED ORDER — ASPIRIN 81 MG PO CHEW
324.0000 mg | CHEWABLE_TABLET | ORAL | Status: AC
  Filled 2012-07-15: qty 4

## 2012-07-15 MED ORDER — CARVEDILOL 25 MG PO TABS
25.0000 mg | ORAL_TABLET | Freq: Two times a day (BID) | ORAL | Status: DC
  Administered 2012-07-15 – 2012-07-19 (×8): 25 mg via ORAL
  Filled 2012-07-15 (×11): qty 1

## 2012-07-15 MED ORDER — DIGOXIN 125 MCG PO TABS
0.1250 mg | ORAL_TABLET | Freq: Every day | ORAL | Status: DC
  Administered 2012-07-15 – 2012-07-18 (×3): 0.125 mg via ORAL
  Filled 2012-07-15 (×5): qty 1

## 2012-07-15 MED ORDER — INSULIN ASPART PROT & ASPART (70-30 MIX) 100 UNIT/ML ~~LOC~~ SUSP
35.0000 [IU] | Freq: Every day | SUBCUTANEOUS | Status: DC
  Filled 2012-07-15: qty 3

## 2012-07-15 MED ORDER — GLUCOSE 40 % PO GEL
ORAL | Status: AC
  Administered 2012-07-15: 37.5 g
  Filled 2012-07-15: qty 1

## 2012-07-15 MED ORDER — FUROSEMIDE 40 MG PO TABS
40.0000 mg | ORAL_TABLET | Freq: Every day | ORAL | Status: DC
  Administered 2012-07-15 – 2012-07-19 (×4): 40 mg via ORAL
  Filled 2012-07-15 (×5): qty 1

## 2012-07-15 MED ORDER — INSULIN ASPART 100 UNIT/ML ~~LOC~~ SOLN
0.0000 [IU] | Freq: Three times a day (TID) | SUBCUTANEOUS | Status: DC
  Administered 2012-07-15: 2 [IU] via SUBCUTANEOUS
  Administered 2012-07-16: 3 [IU] via SUBCUTANEOUS
  Administered 2012-07-17 (×2): 2 [IU] via SUBCUTANEOUS
  Administered 2012-07-18: 1 [IU] via SUBCUTANEOUS
  Administered 2012-07-19: 5 [IU] via SUBCUTANEOUS

## 2012-07-15 MED ORDER — HYDRALAZINE HCL 25 MG PO TABS
25.0000 mg | ORAL_TABLET | Freq: Two times a day (BID) | ORAL | Status: DC
  Administered 2012-07-15 – 2012-07-16 (×3): 25 mg via ORAL
  Filled 2012-07-15 (×5): qty 1

## 2012-07-15 MED ORDER — NITROGLYCERIN 0.4 MG SL SUBL: 0.4000 mg | SUBLINGUAL_TABLET | SUBLINGUAL | Status: DC | PRN

## 2012-07-15 MED ORDER — NITROGLYCERIN 2 % TD OINT
TOPICAL_OINTMENT | TRANSDERMAL | Status: AC
  Administered 2012-07-15: 1 [in_us] via TOPICAL
  Filled 2012-07-15: qty 1

## 2012-07-15 MED ORDER — ACETAMINOPHEN 325 MG PO TABS
650.0000 mg | ORAL_TABLET | ORAL | Status: DC | PRN
  Administered 2012-07-18: 650 mg via ORAL
  Filled 2012-07-15: qty 2

## 2012-07-15 NOTE — Progress Notes (Signed)
[facility] PROGRESS NOTE  Argil Handley WUJ:[id number] DOB: [birth year] DOA: 07/14/2012 PCP: No primary provider on file.  Assessment/Plan:  #1 chest pain/[place] Patient presents patient with typical chest pain midsternal radiating to the left upper extremity and left neck. Patient with multiple cardiac risk factors and prior history of MI status post stents per patient. Cardiac enzymes have been negative x3. Patient is currently chest pain-free. Will check a fasting lipid panel. 2-D echo is currently pending. Continue Coreg, aspirin, digoxin, Lasix, hydralazine, Zocor. We'll consult with cardiology for further evaluation and management.  #2 hypoglycemia Patient with hypoglycemic spells this morning. Discontinue patient's long acting insulin for now. Monitor closely. Sliding scale insulin. Follow.  #3 hypertension Continue Coreg, digoxin, Lasix, hydralazine.  #4 hyperlipidemia Continue Zocor.  #5 poorly controlled type 2 diabetes Hemoglobin A1c is 12.8. CBGs are range from 40-116. Will discontinue patient's long acting insulin for now. Sliding scale insulin.  #6 acute on chronic renal failure Questionable etiology. Renal function is slowly improving. Monitor closely. Follow.  #7 coronary artery disease/history of CHF Patient is currently compensated. See problem #1.  Code Status: Full Family Communication: Updated patient. No family at bedside. Disposition Plan: Home when medically stable   Consultants:  Cardiology pending  Procedures:  2-D echo pending  Antibiotics: None  HPI/Subjective: Patient states prior to presentation he had midsternal chest pain radiating to the left neck and left upper extremity relieved by nitroglycerin. Patient with no chest pain today.  Objective: Filed Vitals:   07/15/12 0220 07/15/12 0346 07/15/12 0655 07/15/12 1116  BP: 185/98 173/87 174/79 204/100  Pulse: 92 77 72 65  Temp:  98.3 F (36.8 C)    TempSrc:  Oral    Resp: 21 16      Height:      Weight:      SpO2: 98% 96%     No intake or output data in the 24 hours ending 07/15/12 1128 Filed Weights   07/14/12 2345  Weight: 89.812 kg (198 lb)    Exam:   General:  NAD  Cardiovascular: RRR. No JVD. No lower extremity edema.  Respiratory: CTAB  Abdomen: Soft/NT/ND/+BS   Data Reviewed: Basic Metabolic Panel:  Lab 07/15/12 9562 07/14/12 2352  NA 139 141  K 3.8 3.3*  CL 102 100  CO2 27 27  GLUCOSE 222* 116*  BUN 22 23  CREATININE 1.58* 2.00*  CALCIUM 8.6 9.3  MG -- --  PHOS -- --   Liver Function Tests:  Lab 07/14/12 2352  AST 31  ALT 12  ALKPHOS 59  BILITOT 0.6  PROT 7.6  ALBUMIN 3.9   No results found for this basename: LIPASE:5,AMYLASE:5 in the last 168 hours No results found for this basename: AMMONIA:5 in the last 168 hours CBC:  Lab 07/15/12 0525 07/14/12 2352  WBC 14.5* 15.8*  NEUTROABS -- 11.4*  HGB 11.4* 12.9*  HCT 36.1* 40.2  MCV 88.7 88.5  PLT 217 242   Cardiac Enzymes:  Lab 07/15/12 0903 07/15/12 0525 07/14/12 2352  CKTOTAL -- -- --  CKMB -- -- --  CKMBINDEX -- -- --  TROPONINI <0.30 <0.30 <0.30   BNP (last 3 results) No results found for this basename: PROBNP:3 in the last 8760 hours CBG:  Lab 07/15/12 0727  GLUCAP 194*    No results found for this or any previous visit (from the past 240 hour(s)).   Studies: Dg Chest 2 View  07/15/2012  *RADIOLOGY REPORT*  Clinical Data: Chest pain.  Bilateral upper  extremity pain.  Prior CABG.  History of hypertension and diabetes.  CHEST - 2 VIEW  Comparison: Two-view chest x-ray 02/20/2011, 01/29/2011, 07/21/2010.  Findings: Prior sternotomy for CABG.  Cardiac silhouette mildly enlarged.  Thoracic aorta mildly atherosclerotic, unchanged.  Hilar and mediastinal contours otherwise unremarkable.  Scarring in the right lower lobe, unchanged.  No new pulmonary parenchymal abnormalities.  No pleural effusions.  Degenerative changes involving the thoracic spine with slight  thoracic scoliosis convex right, unchanged.  IMPRESSION: Stable mild cardiomegaly.  No acute cardiopulmonary disease.   Original Report Authenticated By: Hulan Saas, M.D.     Scheduled Meds:   . [COMPLETED] aspirin  324 mg Oral Once  . aspirin  324 mg Oral NOW   Or  . aspirin  300 mg Rectal NOW  . aspirin  81 mg Oral Daily  . carvedilol  25 mg Oral BID WC  . [COMPLETED] dextrose      . [COMPLETED] dextrose      . dextrose  1 ampule Intravenous Once  . dextrose      . [COMPLETED] digoxin  0.25 mg Intravenous Once  . digoxin  0.125 mg Oral Daily  . enoxaparin (LOVENOX) injection  90 mg Subcutaneous Q12H  . furosemide  40 mg Oral Daily  . glucose-Vitamin C      . glucose-Vitamin C      . hydrALAZINE  25 mg Oral BID  . influenza  inactive virus vaccine  0.5 mL Intramuscular Once  . insulin aspart  0-9 Units Subcutaneous TID WC  . insulin aspart protamine-insulin aspart  35 Units Subcutaneous Q supper  . insulin aspart protamine-insulin aspart  45 Units Subcutaneous Q breakfast  . lisinopril  20 mg Oral BID  . [COMPLETED] metoprolol  5 mg Intravenous Once  . nitroGLYCERIN  0.5 inch Topical Q6H  . [COMPLETED] nitroGLYCERIN  1 inch Topical Once  . [COMPLETED] potassium chloride  20 mEq Oral Once  . [COMPLETED] potassium chloride  40 mEq Oral Once  . simvastatin  40 mg Oral QPM  . sodium chloride  3 mL Intravenous Q12H  . [DISCONTINUED] sodium chloride   Intravenous STAT   Continuous Infusions:   Principal Problem:  *Chest pain Active Problems:  Hypertension  Diabetes mellitus without complication  CAD (coronary artery disease)  High cholesterol  CHF (congestive heart failure)  S/P CABG x 4  Renal failure, acute on chronic    Time spent: > 35 mins    [facility]  [facility] Pager [phone]. If 8PM-8AM, please contact night-coverage at [email or web address], password [facility] 07/15/2012, 11:28 AM  LOS: 1 day

## 2012-07-15 NOTE — H&P (Signed)
Chief Complaint:  sscp  HPI: 51 yo male with h/o cad, dm, htn, hld s/p cabg x 4 in may 12 comes in with sudden onset of sscp with radiationn to left arm and jaw that lasted for several hours earlier this tonight.  Got several doses of ntg in ed which relieved pain.  Pain free now with ntg paste on.  No sob.  No le edema.  Has h/o chf with ef of 30-40% but stays well controlled with no hospitaliztions since his cabg.  Did not experience cp with his MI then, had sob.  No recent illnesses no f/n/v/d.   No cough.    Review of Systems:  olw neg  Past Medical History: Past Medical History  Diagnosis Date  . CHF (congestive heart failure)   . Hypertension   . Diabetes mellitus without complication   . High cholesterol   . CAD (coronary artery disease)   . Depression   . Anxiety    Past Surgical History  Procedure Date  . Cholecystectomy   . Coronary artery bypass graft     Medications: Prior to Admission medications   Medication Sig Start Date End Date Taking? Authorizing Provider  aspirin 81 MG tablet Take 81 mg by mouth daily.   Yes Historical Provider, MD  carvedilol (COREG) 25 MG tablet Take 25 mg by mouth 2 (two) times daily with a meal.   Yes Historical Provider, MD  digoxin (LANOXIN) 0.125 MG tablet Take 0.125 mg by mouth daily.   Yes Historical Provider, MD  furosemide (LASIX) 40 MG tablet Take 40 mg by mouth daily.   Yes Historical Provider, MD  hydrALAZINE (APRESOLINE) 25 MG tablet Take 25 mg by mouth 2 (two) times daily.   Yes Historical Provider, MD  insulin aspart protamine-insulin aspart (NOVOLOG 70/30) (70-30) 100 UNIT/ML injection Inject 35 Units into the skin daily with supper.   Yes Historical Provider, MD  insulin aspart protamine-insulin aspart (NOVOLOG 70/30) (70-30) 100 UNIT/ML injection Inject 45 Units into the skin daily with breakfast.   Yes Historical Provider, MD  lisinopril (PRINIVIL,ZESTRIL) 20 MG tablet Take 20 mg by mouth 2 (two) times daily.   Yes  Historical Provider, MD  simvastatin (ZOCOR) 40 MG tablet Take 40 mg by mouth every evening.   Yes Historical Provider, MD    Allergies:  No Known Allergies  Social History:  reports that he has been smoking.  He has never used smokeless tobacco. He reports that he does not drink alcohol or use illicit drugs.  Family History: neg  Physical Exam: Filed Vitals:   07/15/12 0112 07/15/12 0144 07/15/12 0220 07/15/12 0346  BP: 184/101 173/99 185/98 173/87  Pulse:  92 92 77  Temp:    98.3 F (36.8 C)  TempSrc:    Oral  Resp:  16 21 16   Height:      Weight:      SpO2:  98% 98% 96%   General appearance: alert, cooperative and no distress Neck: no JVD and supple, symmetrical, trachea midline Lungs: clear to auscultation bilaterally Heart: regular rate and rhythm, S1, S2 normal, no murmur, click, rub or gallop Abdomen: soft, non-tender; bowel sounds normal; no masses,  no organomegaly Extremities: extremities normal, atraumatic, no cyanosis or edema Pulses: 2+ and symmetric Skin: Skin color, texture, turgor normal. No rashes or lesions Neurologic: Grossly normal   Labs on Admission:   [facility] 07/14/12 2352  NA 141  K 3.3*  CL 100  CO2 27  GLUCOSE 116*  BUN 23  CREATININE 2.00*  CALCIUM 9.3  MG --  PHOS --    Basename 07/14/12 2352  AST 31  ALT 12  ALKPHOS 59  BILITOT 0.6  PROT 7.6  ALBUMIN 3.9    Basename 07/14/12 2352  WBC 15.8*  NEUTROABS 11.4*  HGB 12.9*  HCT 40.2  MCV 88.5  PLT 242    Basename 07/14/12 2352  CKTOTAL --  CKMB --  CKMBINDEX --  TROPONINI <0.30   Radiological Exams on Admission: Dg Chest 2 View  07/15/2012  *RADIOLOGY REPORT*  Clinical Data: Chest pain.  Bilateral upper extremity pain.  Prior CABG.  History of hypertension and diabetes.  CHEST - 2 VIEW  Comparison: Two-view chest x-ray 02/20/2011, 01/29/2011, 07/21/2010.  Findings: Prior sternotomy for CABG.  Cardiac silhouette mildly enlarged.  Thoracic aorta mildly  atherosclerotic, unchanged.  Hilar and mediastinal contours otherwise unremarkable.  Scarring in the right lower lobe, unchanged.  No new pulmonary parenchymal abnormalities.  No pleural effusions.  Degenerative changes involving the thoracic spine with slight thoracic scoliosis convex right, unchanged.  IMPRESSION: Stable mild cardiomegaly.  No acute cardiopulmonary disease.   Original Report Authenticated By: Hulan Saas, M.D.     Assessment/Plan Present on Admission:  51 yo male with usa/cp h/o cabg/cad/dm/hld/htn . Chest pain . Hypertension . Diabetes mellitus without complication . CAD (coronary artery disease) . High cholesterol . CHF (congestive heart failure) . Renal failure, acute on chronic  ekg shows old inf infarct no old ekg to c/w in muse.  chf is well compensated.  Cr is 2 and don't have baseline so don't know if this is chronic.  Serial cardiac enzyems.  tx as full acs with full dosing lovenox until rules out.  Ck echo in am.  ntg paste until rules out.  Cont coreg/asa/statin/bp meds/ace.  Ssi.  Further w/u depending on results of above tests.     Sharman Garrott A 07/15/2012, 3:53 AM

## 2012-07-15 NOTE — Progress Notes (Signed)
Hypoglycemic Event  CBG: 40  Treatment: 1 tube instant glucose  Symptoms: Sweaty  Follow-up CBG: Time: 1120 CBG Result:40  Possible Reasons for Event: Medication regimen:   Comments/MD notified:Dr. Janee Morn notified, orders received to give 1 amp d50.     Efraim Kaufmann  Remember to initiate Hypoglycemia Order Set & complete

## 2012-07-15 NOTE — ED Notes (Signed)
Patient transported to X-ray 

## 2012-07-15 NOTE — Progress Notes (Signed)
Hypoglycemic Event  CBG:40  Treatment: D50 IV 50 mL  Symptoms: Sweaty  Follow-up CBG: Time:1217 CBG Result:104  Possible Reasons for Event: Medication regimen  Comments/MD notified: MD aware of results.      [name]  Remember to initiate Hypoglycemia Order Set & complete

## 2012-07-15 NOTE — Care Management Note (Addendum)
    Page 1 of 1   07/19/2012     1:48:40 PM   CARE MANAGEMENT NOTE 07/19/2012  Patient:  [name], [name]   Account Number:  [id number]  Date Initiated:  07/15/2012  Documentation initiated by:  GRAVES-BIGELOW,Mckinley Adelstein  Subjective/Objective Assessment:   Pt admitted with cp. Pt states he lives at a shelter in [place]. [facility] [place]. He gets medications via [facility] and he will be able to get some from the clinic once established.     Action/Plan:   CSW did provide pt with some resources. CM will continue to monitor for additional needs.   Anticipated DC Date:  07/16/2012   Anticipated DC Plan:  HOME/SELF CARE      DC Planning Services  CM consult      Choice offered to / List presented to:             Status of service:  Completed, signed off Medicare Important Message given?   (If response is "NO", the following Medicare IM given date fields will be blank) Date Medicare IM given:   Date Additional Medicare IM given:    Discharge Disposition:  HOME/SELF CARE  Per UR Regulation:  Reviewed for med. necessity/level of care/duration of stay  If discussed at Long Length of Stay Meetings, dates discussed:    Comments:  07-19-12 1345 Tomi Bamberger, [place] [phone] CM did speak to pt again and he will f/u with the [facility] for PCP needs. Appointment has been scheduled by pt for the end of the month. CM did ask to see if he would be able to afford medications and pt stated he has some at home and since established at clinic will be ale to get some there for 5.00. Pt has no needs as of right now for CM. Pt was previously on simvistatin and this is more affordable- He is now on lipitor and this medication is not as affordable for pt. Please if able change medication to a cheaper statin for compliance. Thanks   07-16-12 Tomi Bamberger, RN,BSN [phone] Pt will need generics at d/c if possible since referral was for possible  change in BP meds.

## 2012-07-15 NOTE — ED Notes (Signed)
MD at bedside. 

## 2012-07-15 NOTE — Clinical Social Work Psychosocial (Signed)
     Clinical Social Work Department BRIEF PSYCHOSOCIAL ASSESSMENT 07/15/2012  Patient:  [name], [name]     Account Number:  [id number]     Admit date:  07/14/2012  Clinical Social Worker:  Margaree Mackintosh  Date/Time:  07/15/2012 02:37 PM  Referred by:  Physician  Date Referred:  07/15/2012 Referred for  Homelessness   Other Referral:   Interview type:  Patient Other interview type:    PSYCHOSOCIAL DATA Living Status:  OTHER Admitted from facility:   Level of care:   Primary support name:   Primary support relationship to patient:   Degree of support available:   Pt currently residing in [facility].    CURRENT CONCERNS Current Concerns  Financial Resources   Other Concerns:    SOCIAL WORK ASSESSMENT / PLAN Clinical Social Worker recieved referral for "homeless issues".  CSW reviewed chart and met with pt at bedside. CSW introduced self, explained role, and provided support. CSW provided opportunity for pt to process feelings.  CSW utilized Solution Focused Interventions to assist with pt identifying inherent strengths.  CSW provided resources for food, section 8, and housing.  CSW to sign off at this time, please re consult if needed.   Assessment/plan status:  Information/Referral to [facility] Other assessment/ plan:   Information/referral to community resources:   [employer].    PATIENTS/FAMILYS RESPONSE TO PLAN OF CARE: Pt was pleasant and engaged in conversation.  Pt thanked CSW for intervention.

## 2012-07-15 NOTE — Consult Note (Signed)
Reason for Consult: Chest pain, angina, relief NTG   Referring Physician: Dr. Toula Moos [name] is an 51 y.o. male.    Chief Complaint:  Admitted early morning hours 07/15/12 with SSCP  HPI: 51 yo male with h/o cad, dm, htn, hld s/p cabg x 4 in may 12 comes in with sudden onset of sscp with radiationn to left arm and jaw that lasted for several hours earlier the night prior to admit.  Pt was walking from [place] to [facility], because his ride to work did not show. Got several doses of ntg in ed which relieved pain. Pain free with ntg paste on. No sob. No le edema. Has h/o chf with ef of 30-40%, that eventually rose to 45% prior to CABG.  Did not experience cp with his MI then, had sob. No recent illnesses no f/n/v/d. No cough.  Last OV with Dr. Clarene Duke was 05/2011  Pt has had financial issues and has been in the homeless shelter.  He only receives his meds sporadically. Cardiac enzymes have been negative.  No further chest pain.  Coronary artery bypass grafting x4 (left internal mammary artery to LAD, saphenous vein graft to ramus intermedius, saphenous vein graft to obtuse marginal 1, saphenous vein graft to posterior descending.    Past Medical History  Diagnosis Date  . CHF (congestive heart failure)   . Hypertension   . Diabetes mellitus without complication   . High cholesterol   . CAD (coronary artery disease)   . Depression   . Anxiety     Past Surgical History  Procedure Date  . Cholecystectomy   . Coronary artery bypass graft     No family history on file. Social History:  reports that he has been smoking.  He has never used smokeless tobacco. He reports that he does not drink alcohol or use illicit drugs.  Allergies: No Known Allergies  Medications Prior to Admission  Medication Sig Dispense Refill  . aspirin 81 MG tablet Take 81 mg by mouth daily.      . carvedilol (COREG) 25 MG tablet Take 25 mg by mouth 2 (two) times daily with a meal.      . digoxin  (LANOXIN) 0.125 MG tablet Take 0.125 mg by mouth at bedtime.      . furosemide (LASIX) 40 MG tablet Take 40 mg by mouth daily.      . hydrALAZINE (APRESOLINE) 25 MG tablet Take 25 mg by mouth 2 (two) times daily.      . insulin NPH-insulin regular (NOVOLIN 70/30) (70-30) 100 UNIT/ML injection Inject 35-45 Units into the skin 2 (two) times daily with a meal. Takes 45 units with breakfast Takes 35 units with supper      . lisinopril (PRINIVIL,ZESTRIL) 20 MG tablet Take 20 mg by mouth 2 (two) times daily.      . simvastatin (ZOCOR) 40 MG tablet Take 40 mg by mouth every evening.        Results for orders placed during the hospital encounter of 07/14/12 (from the past 48 hour(s))  CBC WITH DIFFERENTIAL     Status: Abnormal   Collection Time   07/14/12 11:52 PM      Component Value Range Comment   WBC 15.8 (*) 4.0 - 10.5 K/uL    RBC 4.54  4.22 - 5.81 MIL/uL    Hemoglobin 12.9 (*) 13.0 - 17.0 g/dL    HCT 16.1  09.6 - 04.5 %    MCV 88.5  78.0 -  100.0 fL    MCH 28.4  26.0 - 34.0 pg    MCHC 32.1  30.0 - 36.0 g/dL    RDW 16.1  09.6 - 04.5 %    Platelets 242  150 - 400 K/uL    Neutrophils Relative 72  43 - 77 %    Lymphocytes Relative 16  12 - 46 %    Monocytes Relative 10  3 - 12 %    Eosinophils Relative 2  0 - 5 %    Basophils Relative 0  0 - 1 %    Neutro Abs 11.4 (*) 1.7 - 7.7 K/uL    Lymphs Abs 2.5  0.7 - 4.0 K/uL    Monocytes Absolute 1.6 (*) 0.1 - 1.0 K/uL    Eosinophils Absolute 0.3  0.0 - 0.7 K/uL    Basophils Absolute 0.0  0.0 - 0.1 K/uL    WBC Morphology WHITE COUNT CONFIRMED ON SMEAR   ATYPICAL LYMPHOCYTES   Smear Review PLATELET COUNT CONFIRMED BY SMEAR     COMPREHENSIVE METABOLIC PANEL     Status: Abnormal   Collection Time   07/14/12 11:52 PM      Component Value Range Comment   Sodium 141  135 - 145 mEq/L    Potassium 3.3 (*) 3.5 - 5.1 mEq/L    Chloride 100  96 - 112 mEq/L    CO2 27  19 - 32 mEq/L    Glucose, Bld 116 (*) 70 - 99 mg/dL    BUN 23  6 - 23 mg/dL     Creatinine, Ser 4.09 (*) 0.50 - 1.35 mg/dL    Calcium 9.3  8.4 - 81.1 mg/dL    Total Protein 7.6  6.0 - 8.3 g/dL    Albumin 3.9  3.5 - 5.2 g/dL    AST 31  0 - 37 U/L    ALT 12  0 - 53 U/L    Alkaline Phosphatase 59  39 - 117 U/L    Total Bilirubin 0.6  0.3 - 1.2 mg/dL    GFR calc non Af Amer 36 (*) >90 mL/min    GFR calc Af Amer 42 (*) >90 mL/min   TROPONIN I     Status: Normal   Collection Time   07/14/12 11:52 PM      Component Value Range Comment   Troponin I <0.30  <0.30 ng/mL   DIGOXIN LEVEL     Status: Abnormal   Collection Time   07/15/12 12:08 AM      Component Value Range Comment   Digoxin Level <0.3 (*) 0.8 - 2.0 ng/mL   TROPONIN I     Status: Normal   Collection Time   07/15/12  5:25 AM      Component Value Range Comment   Troponin I <0.30  <0.30 ng/mL   BASIC METABOLIC PANEL     Status: Abnormal   Collection Time   07/15/12  5:25 AM      Component Value Range Comment   Sodium 139  135 - 145 mEq/L    Potassium 3.8  3.5 - 5.1 mEq/L    Chloride 102  96 - 112 mEq/L    CO2 27  19 - 32 mEq/L    Glucose, Bld 222 (*) 70 - 99 mg/dL    BUN 22  6 - 23 mg/dL    Creatinine, Ser 9.14 (*) 0.50 - 1.35 mg/dL    Calcium 8.6  8.4 - 78.2 mg/dL    GFR  calc non Af Amer 48 (*) >90 mL/min    GFR calc Af Amer 56 (*) >90 mL/min   CBC     Status: Abnormal   Collection Time   07/15/12  5:25 AM      Component Value Range Comment   WBC 14.5 (*) 4.0 - 10.5 K/uL    RBC 4.07 (*) 4.22 - 5.81 MIL/uL    Hemoglobin 11.4 (*) 13.0 - 17.0 g/dL    HCT 16.1 (*) 09.6 - 52.0 %    MCV 88.7  78.0 - 100.0 fL    MCH 28.0  26.0 - 34.0 pg    MCHC 31.6  30.0 - 36.0 g/dL    RDW 04.5  40.9 - 81.1 %    Platelets 217  150 - 400 K/uL   HEMOGLOBIN A1C     Status: Abnormal   Collection Time   07/15/12  5:25 AM      Component Value Range Comment   Hemoglobin A1C 12.8 (*) <5.7 %    Mean Plasma Glucose 321 (*) <117 mg/dL   LIPID PANEL     Status: Abnormal   Collection Time   07/15/12  5:25 AM      Component  Value Range Comment   Cholesterol 165  0 - 200 mg/dL    Triglycerides 64  <914 mg/dL    HDL 47  >78 mg/dL    Total CHOL/HDL Ratio 3.5      VLDL 13  0 - 40 mg/dL    LDL Cholesterol 295 (*) 0 - 99 mg/dL   GLUCOSE, CAPILLARY     Status: Abnormal   Collection Time   07/15/12  7:27 AM      Component Value Range Comment   Glucose-Capillary 194 (*) 70 - 99 mg/dL   TROPONIN I     Status: Normal   Collection Time   07/15/12  9:03 AM      Component Value Range Comment   Troponin I <0.30  <0.30 ng/mL   GLUCOSE, CAPILLARY     Status: Abnormal   Collection Time   07/15/12 10:59 AM      Component Value Range Comment   Glucose-Capillary 35 (*) 70 - 99 mg/dL    Comment 1 Notify RN     GLUCOSE, CAPILLARY     Status: Abnormal   Collection Time   07/15/12 11:20 AM      Component Value Range Comment   Glucose-Capillary 40 (*) 70 - 99 mg/dL   GLUCOSE, CAPILLARY     Status: Abnormal   Collection Time   07/15/12 12:17 PM      Component Value Range Comment   Glucose-Capillary 104 (*) 70 - 99 mg/dL    Dg Chest 2 View  62/09/3084  *RADIOLOGY REPORT*  Clinical Data: Chest pain.  Bilateral upper extremity pain.  Prior CABG.  History of hypertension and diabetes.  CHEST - 2 VIEW  Comparison: Two-view chest x-ray 02/20/2011, 01/29/2011, 07/21/2010.  Findings: Prior sternotomy for CABG.  Cardiac silhouette mildly enlarged.  Thoracic aorta mildly atherosclerotic, unchanged.  Hilar and mediastinal contours otherwise unremarkable.  Scarring in the right lower lobe, unchanged.  No new pulmonary parenchymal abnormalities.  No pleural effusions.  Degenerative changes involving the thoracic spine with slight thoracic scoliosis convex right, unchanged.  IMPRESSION: Stable mild cardiomegaly.  No acute cardiopulmonary disease.   Original Report Authenticated By: Hulan Saas, M.D.     ROS: General:no colds or fevers Skin:no rashes HEENT:no blurred vision CV:see HPI PUL:no SOB  GI:1 episode of diarrhea yesterday    GU:no hematuria MS:no arthritic pain Neuro:no syncope Endo:uncontrolled diabetes   Blood pressure 143/80, pulse 63, temperature 97.8 F (36.6 C), temperature source Oral, resp. rate 18, height 6\' 2"  (1.88 m), weight 89.812 kg (198 lb), SpO2 98.00%. PE: General:alert and oriented NAD Skin:warm and dry, brisk capillary refill HEENT:normocephalic, sclera clear Neck:no JVD, no bruits Heart:S1S2 RRR, soft systolic murmur Lungs:clear without rales, rhonchi or wheezes Abd:+ BS, soft non tender Ext:no edema Neuro:alert and oriented X 3, MAE    Assessment/Plan Principal Problem:  *Chest pain Active Problems:  Hypertension  Diabetes mellitus without complication  CAD (coronary artery disease)  High cholesterol  CHF (congestive heart failure)  S/P CABG x 4, 01/2011  Renal failure, acute on chronic  PLAN: Cardiac enzymes are negative. , mild renal insuff. LDL elevated at 105.  HGB A1C 12.8  This sounds anginal.  Relief with NTG and no further pain with NTG paste.  Dr. Tresa Endo is evaluating and plan for cardiac cath.  Will need assistance at discharge with medications.   INGOLD,LAURA R 07/15/2012, 3:04 PM   Patient seen and examined. Agree with assessment and plan. Pt is 51 yo AAM who is s/p CABG in 01/2011 who now presents with recurrent nitrate responsive chest tightness concerning for exertional angina. Pt has a history of HTN, hyperlipidemia, DM and tobacco use and has had difficulty with complete medical compliance due to financial constraints. ECG shows lateral T wave abnormalities. Initial enzymes have been negative. Cr today is improved. Needs improved diabetes control with HbA1c 12.8.  Rec definitive repeat cath tomorrow if renal function continue to improve or stabilize. Hold lovenox in am for potential cath.   Lennette Bihari, MD, [facility] 07/15/2012 4:04 PM

## 2012-07-15 NOTE — Progress Notes (Signed)
ANTICOAGULATION CONSULT NOTE - Initial Consult  Pharmacy Consult for lovenox Indication: chest pain/ACS  No Known Allergies  Patient Measurements: Height: 6\' 2"  (188 cm) Weight: 198 lb (89.812 kg) IBW/kg (Calculated) : 82.2  Heparin Dosing Weight:   Vital Signs: Temp: 98.3 F (36.8 C) (11/07 0346) Temp src: Oral (11/07 0346) BP: 173/87 mmHg (11/07 0346) Pulse Rate: 77  (11/07 0346)  Labs:  Basename 07/14/12 2352  HGB 12.9*  HCT 40.2  PLT 242  APTT --  LABPROT --  INR --  HEPARINUNFRC --  CREATININE 2.00*  CKTOTAL --  CKMB --  TROPONINI <0.30    Estimated Creatinine Clearance: 49.7 ml/min (by C-G formula based on Cr of 2).   Medical History: Past Medical History  Diagnosis Date  . CHF (congestive heart failure)   . Hypertension   . Diabetes mellitus without complication   . High cholesterol   . CAD (coronary artery disease)   . Depression   . Anxiety     Medications:  Prescriptions prior to admission  Medication Sig Dispense Refill  . aspirin 81 MG tablet Take 81 mg by mouth daily.      . carvedilol (COREG) 25 MG tablet Take 25 mg by mouth 2 (two) times daily with a meal.      . digoxin (LANOXIN) 0.125 MG tablet Take 0.125 mg by mouth daily.      . furosemide (LASIX) 40 MG tablet Take 40 mg by mouth daily.      . hydrALAZINE (APRESOLINE) 25 MG tablet Take 25 mg by mouth 2 (two) times daily.      . insulin aspart protamine-insulin aspart (NOVOLOG 70/30) (70-30) 100 UNIT/ML injection Inject 35 Units into the skin daily with supper.      . insulin aspart protamine-insulin aspart (NOVOLOG 70/30) (70-30) 100 UNIT/ML injection Inject 45 Units into the skin daily with breakfast.      . lisinopril (PRINIVIL,ZESTRIL) 20 MG tablet Take 20 mg by mouth 2 (two) times daily.      . simvastatin (ZOCOR) 40 MG tablet Take 40 mg by mouth every evening.        Assessment: 51 yo male with htn dm hld cad depression and anxiety reports with ACS adx to r/o mi. No oral  anticoagulants in current med list.  Goal of Therapy:  Full dose lovenox Monitor platelets by anticoagulation protocol: Yes   Plan:  lovenox 90 mg q12hours.  Cbc q72 hours.    Janice Coffin 07/15/2012,4:21 AM

## 2012-07-16 ENCOUNTER — Encounter (HOSPITAL_COMMUNITY)
Admission: EM | Disposition: A | Discharge status: Home / Self Care | Payer: Self-pay | Source: Home / Self Care | Attending: Internal Medicine

## 2012-07-16 DIAGNOSIS — N179 Acute kidney failure, unspecified: Secondary | ICD-10-CM

## 2012-07-16 DIAGNOSIS — N189 Chronic kidney disease, unspecified: Secondary | ICD-10-CM

## 2012-07-16 DIAGNOSIS — I10 Essential (primary) hypertension: Secondary | ICD-10-CM

## 2012-07-16 DIAGNOSIS — E162 Hypoglycemia, unspecified: Secondary | ICD-10-CM

## 2012-07-16 HISTORY — PX: LEFT HEART CATHETERIZATION WITH CORONARY/GRAFT ANGIOGRAM: SHX5450

## 2012-07-16 LAB — BASIC METABOLIC PANEL
BUN: 17 mg/dL (ref 6–23)
Creatinine, Ser: 1.19 mg/dL (ref 0.50–1.35)
GFR calc Af Amer: 79 mL/min — ABNORMAL LOW (ref >90)
GFR calc non Af Amer: 68 mL/min — ABNORMAL LOW (ref >90)
Potassium: 3.8 mEq/L (ref 3.5–5.1)

## 2012-07-16 LAB — CBC
MCH: 28.3 pg (ref 26.0–34.0)
MCHC: 31.7 g/dL (ref 30.0–36.0)
Platelets: 213 10*3/uL (ref 150–400)
RBC: 4.2 MIL/uL — ABNORMAL LOW (ref 4.22–5.81)

## 2012-07-16 LAB — GLUCOSE, CAPILLARY
Glucose-Capillary: 111 mg/dL — ABNORMAL HIGH (ref 70–99)
Glucose-Capillary: 218 mg/dL — ABNORMAL HIGH (ref 70–99)

## 2012-07-16 SURGERY — LEFT HEART CATHETERIZATION WITH CORONARY/GRAFT ANGIOGRAM
Anesthesia: LOCAL

## 2012-07-16 MED ORDER — MIDAZOLAM HCL 2 MG/2ML IJ SOLN
INTRAMUSCULAR | Status: AC
  Filled 2012-07-16: qty 2

## 2012-07-16 MED ORDER — SODIUM CHLORIDE 0.9 % IJ SOLN
3.0000 mL | Freq: Two times a day (BID) | INTRAMUSCULAR | Status: DC
  Administered 2012-07-16 – 2012-07-17 (×2): 3 mL via INTRAVENOUS

## 2012-07-16 MED ORDER — HYDRALAZINE HCL 20 MG/ML IJ SOLN
INTRAMUSCULAR | Status: AC
  Filled 2012-07-16: qty 1

## 2012-07-16 MED ORDER — HYDRALAZINE HCL 50 MG PO TABS
50.0000 mg | ORAL_TABLET | Freq: Two times a day (BID) | ORAL | Status: DC
  Administered 2012-07-16 – 2012-07-17 (×2): 50 mg via ORAL
  Filled 2012-07-16 (×4): qty 1

## 2012-07-16 MED ORDER — NITROGLYCERIN 0.2 MG/ML ON CALL CATH LAB
INTRAVENOUS | Status: AC
  Filled 2012-07-16: qty 1

## 2012-07-16 MED ORDER — SODIUM CHLORIDE 0.9 % IV SOLN: 250.0000 mL | INTRAVENOUS | Status: DC

## 2012-07-16 MED ORDER — LABETALOL HCL 5 MG/ML IV SOLN: 10.0000 mg | Freq: Once | INTRAVENOUS | Status: DC

## 2012-07-16 MED ORDER — HEPARIN (PORCINE) IN NACL 2-0.9 UNIT/ML-% IJ SOLN
INTRAMUSCULAR | Status: AC
  Filled 2012-07-16: qty 1000

## 2012-07-16 MED ORDER — LABETALOL HCL 5 MG/ML IV SOLN
INTRAVENOUS | Status: AC
  Filled 2012-07-16: qty 4

## 2012-07-16 MED ORDER — SODIUM CHLORIDE 0.9 % IJ SOLN: 3.0000 mL | INTRAMUSCULAR | Status: DC | PRN

## 2012-07-16 MED ORDER — SODIUM CHLORIDE 0.9 % IV SOLN
1.0000 mL/kg/h | INTRAVENOUS | Status: AC
  Administered 2012-07-16: 1 mL/kg/h via INTRAVENOUS

## 2012-07-16 MED ORDER — ACETAMINOPHEN 325 MG PO TABS: 650.0000 mg | ORAL_TABLET | ORAL | Status: DC | PRN

## 2012-07-16 MED ORDER — FENTANYL CITRATE 0.05 MG/ML IJ SOLN
INTRAMUSCULAR | Status: AC
  Filled 2012-07-16: qty 2

## 2012-07-16 MED ORDER — LIDOCAINE HCL (PF) 1 % IJ SOLN
INTRAMUSCULAR | Status: AC
  Filled 2012-07-16: qty 30

## 2012-07-16 MED ORDER — ONDANSETRON HCL 4 MG/2ML IJ SOLN: 4.0000 mg | Freq: Four times a day (QID) | INTRAMUSCULAR | Status: DC | PRN

## 2012-07-16 MED ORDER — LABETALOL HCL 5 MG/ML IV SOLN
10.0000 mg | Freq: Once | INTRAVENOUS | Status: AC
  Administered 2012-07-16: 10 mg via INTRAVENOUS

## 2012-07-16 NOTE — Progress Notes (Signed)
Subjective: No further chest pain  Objective: Vital signs in last 24 hours: Temp:  [97.8 F (36.6 C)-98.4 F (36.9 C)] 98.4 F (36.9 C) (11/08 0500) Pulse Rate:  [55-65] 55  (11/08 0939) Resp:  [18-20] 20  (11/08 0500) BP: (143-204)/(72-100) 173/80 mmHg (11/08 0500) SpO2:  [97 %-99 %] 97 % (11/08 0500) Weight change:  Last BM Date: 07/15/12 Intake/Output from previous day: +960    11/07 0701 - 11/08 0700 In: 960 [P.O.:960] Out: -  Intake/Output this shift:    PE: General appearance: alert, cooperative, appears stated age and poor dentition - has appearance of facial droop, but due to poor dentition - not noted on neuro exam Neck: no adenopathy, no carotid bruit, no JVD, supple, symmetrical, trachea midline and thyroid not enlarged, symmetric, no tenderness/mass/nodules Lungs: clear to auscultation bilaterally, normal percussion bilaterally and non-labored Heart: regular rate and rhythm, S1, S2 normal, no murmur, click, rub or gallop Abdomen: soft, non-tender; bowel sounds normal; no masses,  no organomegaly Extremities: extremities normal, atraumatic, no cyanosis or edema Pulses: 2+ and symmetric Neurologic: Mental status: Alert, oriented, thought content appropriate Cranial nerves: normal   Lab Results:  Basename 07/16/12 0607 07/15/12 0525  WBC 7.5 14.5*  HGB 11.9* 11.4*  HCT 37.5* 36.1*  PLT 213 217   BMET  Basename 07/16/12 0607 07/15/12 0525  NA 138 139  K 3.8 3.8  CL 104 102  CO2 28 27  GLUCOSE 176* 222*  BUN 17 22  CREATININE 1.19 1.58*  CALCIUM 8.8 8.6    Basename 07/15/12 1616 07/15/12 0903  TROPONINI <0.30 <0.30    Lab Results  Component Value Date   CHOL 165 07/15/2012   HDL 47 07/15/2012   LDLCALC 105* 07/15/2012   TRIG 64 07/15/2012   CHOLHDL 3.5 07/15/2012   Lab Results  Component Value Date   HGBA1C 12.8* 07/15/2012      Hepatic Function Panel  Basename 07/14/12 2352  PROT 7.6  ALBUMIN 3.9  AST 31  ALT 12  ALKPHOS 59  BILITOT 0.6   BILIDIR --  IBILI --    Basename 07/15/12 0525  CHOL 165    Studies/Results: Dg Chest 2 View  07/15/2012  *RADIOLOGY REPORT*  Clinical Data: Chest pain.  Bilateral upper extremity pain.  Prior CABG.  History of hypertension and diabetes.  CHEST - 2 VIEW  Comparison: Two-view chest x-ray 02/20/2011, 01/29/2011, 07/21/2010.  Findings: Prior sternotomy for CABG.  Cardiac silhouette mildly enlarged.  Thoracic aorta mildly atherosclerotic, unchanged.  Hilar and mediastinal contours otherwise unremarkable.  Scarring in the right lower lobe, unchanged.  No new pulmonary parenchymal abnormalities.  No pleural effusions.  Degenerative changes involving the thoracic spine with slight thoracic scoliosis convex right, unchanged.  IMPRESSION: Stable mild cardiomegaly.  No acute cardiopulmonary disease.   Original Report Authenticated By: Hulan Saas, M.D.     Medications: I have reviewed pts medications.    . [EXPIRED] aspirin  324 mg Oral NOW   Or  . [EXPIRED] aspirin  300 mg Rectal NOW  . [COMPLETED] aspirin  324 mg Oral Pre-Cath  . aspirin  81 mg Oral Daily  . carvedilol  25 mg Oral BID WC  . [COMPLETED] dextrose      . [COMPLETED] dextrose      . [COMPLETED] dextrose  1 ampule Intravenous Once  . [COMPLETED] dextrose      . diazepam  5 mg Oral On Call  . digoxin  0.125 mg Oral Daily  . enoxaparin (LOVENOX)  injection  90 mg Subcutaneous Q12H  . furosemide  40 mg Oral Daily  . [EXPIRED] glucose-Vitamin C      . [EXPIRED] glucose-Vitamin C      . hydrALAZINE  25 mg Oral BID  . [COMPLETED] influenza  inactive virus vaccine  0.5 mL Intramuscular Once  . insulin aspart  0-9 Units Subcutaneous TID WC  . lisinopril  20 mg Oral BID  . nitroGLYCERIN  0.5 inch Topical Q6H  . simvastatin  40 mg Oral QPM  . sodium chloride  3 mL Intravenous Q12H  . sodium chloride  3 mL Intravenous Q12H  . [DISCONTINUED] diazepam  5 mg Oral On Call  . [DISCONTINUED] insulin aspart protamine-insulin aspart  18  Units Subcutaneous Q supper  . [DISCONTINUED] insulin aspart protamine-insulin aspart  20 Units Subcutaneous Q breakfast  . [DISCONTINUED] insulin aspart protamine-insulin aspart  35 Units Subcutaneous Q supper  . [DISCONTINUED] insulin aspart protamine-insulin aspart  45 Units Subcutaneous Q breakfast    Assessment/Plan: Principal Problem:  *Chest pain Active Problems:  Hypertension  Diabetes mellitus without complication  CAD (coronary artery disease)  High cholesterol  CHF (congestive heart failure)  S/P CABG x 4, 01/2011-LIMA-LAD; VG-Ramus;VG-OM1; VG-PDA  Renal failure, acute on chronic  Hypoglycemia  PLAN: for cardiac cath today.  Cr improved with IV fluids. Has had some hypoglycemic episodes.  LOS: 2 days   INGOLD,LAURA R 07/16/2012, 10:01 AM  I seen and evaluated the patient this morning along with the PA/NP. I agree with their findings, examination as well as impression recommendations.   51 y/o homeless man with relatively recent CABG x 4 in 01/2011, Ischemic CM pw/ SSx of Unstable Angina (CPradiating to jaw @ rest)  SSx relieved with NTG -- R/o for MI.  Not sure of medical adherence /due to ?? Ability to obtain meds.  Plan is LHC & grafts angiography today to assess new disease.  The procedure with Risks/Benefits/Alternatives and Indications was reviewed with the patient.  All questions were answered.    Risks / Complications include, but not limited to: Death, MI, CVA/TIA, VF/VT (with defibrillation), Bradycardia (need for temporary pacer placement), contrast induced nephropathy, bleeding / bruising / hematoma / pseudoaneurysm, vascular or coronary injury (with possible emergent CT or Vascular Surgery), adverse medication reactions, infection.    The patient voice understanding and agree to proceed.   I have signed the consent form and placed it on the chart for patient signature and RN witness.     Marykay Lex, M.D., M.S. [facility] [address] [place], [place]  [address]  [phone]  07/16/2012 10:29 AM

## 2012-07-16 NOTE — CV Procedure (Signed)
[facility]     CARDIAC CATHETERIZATION REPORT  [name]   MRN: [id number] 08-20-1961   ADMIT DATE:  07/14/2012  Performing Cardiologist: Marykay Lex Primary Physician: No primary provider on file. Primary Cardiologist:  [facility]  Procedures Performed:  Left Heart Catheterization via 5 Fr Right Common Femoral Artery access  Left Ventriculography, (RAO) 10 ml/sec for  30 ml total contrast  Native Coronary Angiography  Internal Mammary Graft-LAD Angiography   Saphenous Vein Graft Angiography) x 3  Arteriotomy closure with Mynx closure device  Indication(s): Chest pain concerning for unstable angina  Severe hypertension  Known coronary artery disease status post non-STEMI with 4 vessel CABG in May 2012  History: 51 y.o. male  Consent: This procedure has been fully reviewed with the patient and written informed consent has been obtained. Consent for signed by MD and patient with RN witness -- placed on chart.  Procedure: The patient was brought to the 2nd Floor [facility] in the fasting state and prepped and draped in the usual sterile fashion for  right groin access. Sterile technique was used including antiseptics, cap, gloves, gown, hand hygiene, mask and sheet.  Skin prep: Chlorhexidine.  Time Out: Verified patient identification, verified procedure, site/side was marked, verified correct patient position, special equipment/implants available, medications/allergies/relevent history reviewed, required imaging and test results available.  Performed  The right femoral head was identified using tactile and fluoroscopic technique.  The right groin was anesthetized with 1% subcutaneous Lidocaine.  The right Common Femoral Artery was accessed using the Modified Seldinger Technique with placement of a antimicrobial bonded/coated single lumen (5 Fr) sheath.  The sheath was aspirated and  flushed.    A 5  Fr JL4 followed by JR 4 Catheter  were advanced of over a Standard J wire into the ascending Aorta and used to engage the  ft then Right  Coronary Artery.  Multiple cineangiographic views of the both native Coronary Artery systems were performed.    The JR 4 catheter was then used to engage the 3 vein grafts and multiple cineangiographic views were obtained.  The cath was then redirected into the Left Subclavian Artery, and exchanged over a wire for first an IMA followed by a Bernstein catheter that was used to successfully performable palsy and drip views of the Left Internal Mammary Artery to the LAD.   The Bernstein catheter was then used again to attempt to engage the SVG to RCA, to confirm that the stump occlusion.  This catheter was then exchanged over the  standard  J wire for an angled Pigtail catheter that was advanced across the Aortic Valve.  LV hemodynamics were measured and Left Ventriculography was performed.  LV hemodynamics were then re-sampled, and the catheter was pulled back across the Aortic Valve for measurement of "pull-back" gradient.  The catheter and the wire were removed completely out of the body.  A nonselective right common femoral antrum was performed via the sheath demonstrating excellent access location. The groin was then reprepped in sterile fashion, and the arteriotomy was closed with a 5 French Mynx closure device.  The patient was transported to the PACU holding area in a hemodynamically stable, chest pain free condition.   The patient  was stable before, during and following the procedure.   Patient did tolerate procedure well. There were not complications.  EBL: < 20 mL  Medications:  Sedation:  2 mg IV Versed, 75 IV mcg  Fentanyl  Contrast:  140 Omnipaque  Hydralazine 20 mg IV  Hemodynamics:  Opening blood pressure was 200/94  mmHg with a mean of 131 mm agree.  Central Aortic Pressure / Mean Aortic Pressure:  Following 20 mg IV hydralazine  and sedation space--144/76 mmHg; 102 mmHg  LV Pressure / LV End diastolic Pressure:  140/6 mmHg; EDP 9 mmHg  Left Ventriculography:  EF:  50-55%  Wall Motion: Relatively normal on this 2-D image  Coronary Angiographic Data: Left Main:  Large-caliber vessel which trifurcates into the LAD, Ramus Intermedius and Circumflex. Angiographically normal.  Left Anterior Descending (LAD):  Large-caliber vessel that is occluded in the mid vessel. This is after after giving off a major septal perforator followed by a 2 more diagonals and septal perforator. After third septal perforator the vessel the is an occluded with competitive flow. The second tube septal perforators are large in caliber refusing most of the septum.  1st diagonal (D1):  Moderate caliber vessel covering large distribution; diffuse luminal irregularities.  2nd diagonal (D2):  Small to moderate caliber vessel with ostial 70% lesion. Not favorable PCI target due to 1.5-2 mm vessel  3rd diagonal (D3):  Very small caliber vessel, diffusely diseased.  Circumflex (LCx):  Large-caliber vessel that is totally occluded at what appears to be at a OM branch. No distal flow not noted distally.  The only remaining branch is an atrial branch.  Ramus Intermedius:  Large-caliber vessel with ostial 60% lesion. There is an inferior branch is being perfused with competitive flow noted in the main branch. Right Coronary Artery:  large-caliber dominant vessel there is diffuse disease in the main vessel with 40% distal stenosis. There is a major RV marginal branch of the genu. The vessel then branches into very small-caliber bifurcating posterior descending artery with a 60-70 cystinosis at the bifurcation point. These vessels are subcutaneous millimeter diameter. The RCA then proceeds into the right posterior AV groove giving rise to several small caliber posterolateral branches. For the first there is a roughly 70% lesion. Again this is a < 2mm  vessel.  Grafts  LIMA - LAD: Widely patent with the distal LAD being diffusely diseased, it wraps the apex.  SVG - OM: Widely patent vessel with antegrade flow filling of a large bifurcating OM system. There is retrograde flow back to the native circumflex but not further downstream.  SVG - RAMUS: Widely patent with antegrade flow diffusing 40 apex. Retrograde flow perfuses back to the left main and down either the circumflex complex versus the other branch of the ramus. At this occasion point there is a severe 90% stenosis but the vessel is roughly 1.5 - 2.0 mm  SVG - RCA (RPDA/RPL): Flush occluded at the aortic ostium  Impression:  3 of 4 patent grafts with occluded SVG diffusely diseased distal RCA.    Severe systemic hypertension  Small-caliber diffusely diseased native Coronary Arteries  Well-preserved ejection fraction after initially been diagnosed with Ischemic Cardiomyopathy pre-CABG  No PCI amenable target the treat for chest pain -- potential culprits are all roughly 2 mm diameter or smaller. And in this patient who has limited resources and significant medicine non-adherence, medical therapy should be referred to avoid potential in stent or in PTCA segment stenosis/thrombosis.   Plan:  Standard post cath care with blood pressure control.  I have increase his hydralazine 50 twice a day. We'll need to ensure that he is able to get his medications on regular basis. I have counseled the Case Management  assist with this.  Likely etiology of her chest pain is probably uncontrolled hypertension with diffuse distal vessel disease.  He should be stable for discharge for cardiac standpoint once his medication regimen has been stabilized. This could be either this afternoon or tomorrow morning. He'll need a followup at our office in a few weeks to ensure his blood pressure is well-controlled.  The case and results was discussed with the patient. The case and results was discussed  with the patient's Cardiologist.  Time Spend Directly with Patient:  45 minutes  Leonard Hendler W, M.D., M.S. [facility] [address] [place], [place]  [address]  [phone]  07/16/2012 12:16 PM

## 2012-07-16 NOTE — Progress Notes (Signed)
[facility] PROGRESS NOTE  Leelan Holdeman WUJ:[id number] DOB: 02/10/1961 DOA: 07/14/2012 PCP: No primary provider on file.  Assessment/Plan:  #1 chest pain/USA Patient presented with typical chest pain midsternal radiating to the left upper extremity and left neck. Patient with multiple cardiac risk factors and prior history of MI status post stents per patient. Cardiac enzymes have been negative x3. Patient is currently chest pain-free. Fasting lipid panel with LDL of 105. 2-D echo is currently pending. Continue Coreg, aspirin, digoxin, Lasix, hydralazine, Zocor. ?? Change zocor to crestor will defer to cardiology. Patient for cardiac catherization today per cardiology. Cardiology ff and appreciate input and rxcs.   #2 hypoglycemia Patient with hypoglycemic spells this morning. Improved with discontining patient's long acting insulin for now. Monitor closely. Sliding scale insulin. Follow.  #3 hypertension Continue Coreg, digoxin, Lasix, hydralazine.  #4 hyperlipidemia LDL = 105. Continue Zocor. Goal LDL < 70. ?? Change zocor to crestor will defer to cardiology.  #5 poorly controlled type 2 diabetes Hemoglobin A1c is 12.8. CBGs are range from 116 - 140. Hypoglycemia improved with d/c of long acting insulin. Follow and if CBG elevated will need to resume at much lower dose. Continue Sliding scale insulin.  #6 acute on ??chronic renal failure Questionable etiology. Renal function is  Improving and currently normalized with Cr at 1.19. Follow.  #7 coronary artery disease/history of CHF Patient is currently compensated. See problem #1.  Code Status: Full Family Communication: Updated patient. No family at bedside. Disposition Plan: Home when medically stable   Consultants:  Cardiology: Dr Tresa Endo 07/15/12  Procedures:  2-D echo pending  Antibiotics: None  HPI/Subjective: Patient denies any chest pain today. No complaints.  Objective: Filed Vitals:   07/15/12 1300  07/15/12 1840 07/15/12 2100 07/16/12 0500  BP: 143/80 171/85 161/72 173/80  Pulse: 63  58 60  Temp: 97.8 F (36.6 C)  98.4 F (36.9 C) 98.4 F (36.9 C)  TempSrc:      Resp: 18  18 20   Height:      Weight:      SpO2: 98%  99% 97%    Intake/Output Summary (Last 24 hours) at 07/16/12 0854 Last data filed at 07/15/12 1700  Gross per 24 hour  Intake    600 ml  Output      0 ml  Net    600 ml   Filed Weights   07/14/12 2345  Weight: 89.812 kg (198 lb)    Exam:   General:  NAD  Cardiovascular: RRR. No JVD. No lower extremity edema.  Respiratory: CTAB  Abdomen: Soft/NT/ND/+BS   Data Reviewed: Basic Metabolic Panel:  Lab 07/16/12 9562 07/15/12 0525 07/14/12 2352  NA 138 139 141  K 3.8 3.8 3.3*  CL 104 102 100  CO2 28 27 27   GLUCOSE 176* 222* 116*  BUN 17 22 23   CREATININE 1.19 1.58* 2.00*  CALCIUM 8.8 8.6 9.3  MG -- -- --  PHOS -- -- --   Liver Function Tests:  Lab 07/14/12 2352  AST 31  ALT 12  ALKPHOS 59  BILITOT 0.6  PROT 7.6  ALBUMIN 3.9   No results found for this basename: LIPASE:5,AMYLASE:5 in the last 168 hours No results found for this basename: AMMONIA:5 in the last 168 hours CBC:  Lab 07/16/12 0607 07/15/12 0525 07/14/12 2352  WBC 7.5 14.5* 15.8*  NEUTROABS -- -- 11.4*  HGB 11.9* 11.4* 12.9*  HCT 37.5* 36.1* 40.2  MCV 89.3 88.7 88.5  PLT 213 217 242  Cardiac Enzymes:  Lab 07/15/12 1616 07/15/12 0903 07/15/12 0525 07/14/12 2352  CKTOTAL -- -- -- --  CKMB -- -- -- --  CKMBINDEX -- -- -- --  TROPONINI <0.30 <0.30 <0.30 <0.30   BNP (last 3 results) No results found for this basename: PROBNP:3 in the last 8760 hours CBG:  Lab 07/16/12 0744 07/15/12 2126 07/15/12 1646 07/15/12 1217 07/15/12 1120  GLUCAP 140* 167* 116* 104* 40*    No results found for this or any previous visit (from the past 240 hour(s)).   Studies: Dg Chest 2 View  07/15/2012  *RADIOLOGY REPORT*  Clinical Data: Chest pain.  Bilateral upper extremity pain.   Prior CABG.  History of hypertension and diabetes.  CHEST - 2 VIEW  Comparison: Two-view chest x-ray 02/20/2011, 01/29/2011, 07/21/2010.  Findings: Prior sternotomy for CABG.  Cardiac silhouette mildly enlarged.  Thoracic aorta mildly atherosclerotic, unchanged.  Hilar and mediastinal contours otherwise unremarkable.  Scarring in the right lower lobe, unchanged.  No new pulmonary parenchymal abnormalities.  No pleural effusions.  Degenerative changes involving the thoracic spine with slight thoracic scoliosis convex right, unchanged.  IMPRESSION: Stable mild cardiomegaly.  No acute cardiopulmonary disease.   Original Report Authenticated By: Hulan Saas, M.D.     Scheduled Meds:    . [EXPIRED] aspirin  324 mg Oral NOW   Or  . [EXPIRED] aspirin  300 mg Rectal NOW  . [COMPLETED] aspirin  324 mg Oral Pre-Cath  . aspirin  81 mg Oral Daily  . carvedilol  25 mg Oral BID WC  . [COMPLETED] dextrose      . [COMPLETED] dextrose      . [COMPLETED] dextrose  1 ampule Intravenous Once  . [COMPLETED] dextrose      . diazepam  5 mg Oral On Call  . digoxin  0.125 mg Oral Daily  . enoxaparin (LOVENOX) injection  90 mg Subcutaneous Q12H  . furosemide  40 mg Oral Daily  . [EXPIRED] glucose-Vitamin C      . [EXPIRED] glucose-Vitamin C      . hydrALAZINE  25 mg Oral BID  . [COMPLETED] influenza  inactive virus vaccine  0.5 mL Intramuscular Once  . insulin aspart  0-9 Units Subcutaneous TID WC  . lisinopril  20 mg Oral BID  . nitroGLYCERIN  0.5 inch Topical Q6H  . simvastatin  40 mg Oral QPM  . sodium chloride  3 mL Intravenous Q12H  . sodium chloride  3 mL Intravenous Q12H  . [DISCONTINUED] diazepam  5 mg Oral On Call  . [DISCONTINUED] insulin aspart protamine-insulin aspart  18 Units Subcutaneous Q supper  . [DISCONTINUED] insulin aspart protamine-insulin aspart  20 Units Subcutaneous Q breakfast  . [DISCONTINUED] insulin aspart protamine-insulin aspart  35 Units Subcutaneous Q supper  .  [DISCONTINUED] insulin aspart protamine-insulin aspart  45 Units Subcutaneous Q breakfast   Continuous Infusions:    . sodium chloride 1 mL/kg/hr (07/15/12 2003)    Principal Problem:  *Chest pain Active Problems:  Hypertension  Diabetes mellitus without complication  CAD (coronary artery disease)  High cholesterol  CHF (congestive heart failure)  S/P CABG x 4, 01/2011-LIMA-LAD; VG-Ramus;VG-OM1; VG-PDA  Renal failure, acute on chronic  Hypoglycemia    Time spent: > 35 mins    [facility]  [facility] Pager [phone]. If 8PM-8AM, please contact night-coverage at [email or web address], password [facility] 07/16/2012, 8:54 AM  LOS: 2 days

## 2012-07-16 NOTE — Progress Notes (Signed)
Inpatient Diabetes Program Recommendations  AACE/ADA: New Consensus Statement on Inpatient Glycemic Control (2013)  Target Ranges:  Prepandial:   less than 140 mg/dL      Peak postprandial:   less than 180 mg/dL (1-2 hours)      Critically ill patients:  140 - 180 mg/dL   Reason for Visit: Hypoglycemia using home dose of 70/30 Now need for some basal and/or meal coverage:  Inpatient Diabetes Program Recommendations Insulin - Basal: If not going to resume any 70/30 insulin, please consider addition of 1-15 units Lantus. Insulin - Meal Coverage: Might want to consider starting with 10-15 units 70/30 bid  Note: Thank you, Lenor Coffin, RN, CNS, Diabetes Coordinator [phone])

## 2012-07-17 LAB — BASIC METABOLIC PANEL
BUN: 14 mg/dL (ref 6–23)
CO2: 26 mEq/L (ref 19–32)
Calcium: 8.7 mg/dL (ref 8.4–10.5)
Chloride: 105 mEq/L (ref 96–112)
Creatinine, Ser: 1.12 mg/dL (ref 0.50–1.35)
GFR calc Af Amer: 85 mL/min — ABNORMAL LOW (ref >90)
GFR calc non Af Amer: 73 mL/min — ABNORMAL LOW (ref >90)
Glucose, Bld: 174 mg/dL — ABNORMAL HIGH (ref 70–99)
Potassium: 3.7 mEq/L (ref 3.5–5.1)
Sodium: 139 mEq/L (ref 135–145)

## 2012-07-17 LAB — GLUCOSE, CAPILLARY
Glucose-Capillary: 174 mg/dL — ABNORMAL HIGH (ref 70–99)
Glucose-Capillary: 100 mg/dL — ABNORMAL HIGH (ref 70–99)

## 2012-07-17 LAB — CBC
HCT: 35.5 % — ABNORMAL LOW (ref 39.0–52.0)
Hemoglobin: 11.4 g/dL — ABNORMAL LOW (ref 13.0–17.0)
MCH: 28.1 pg (ref 26.0–34.0)
MCHC: 32.1 g/dL (ref 30.0–36.0)
MCV: 87.7 fL (ref 78.0–100.0)
Platelets: 205 10*3/uL (ref 150–400)
RBC: 4.05 MIL/uL — ABNORMAL LOW (ref 4.22–5.81)
RDW: 13 % (ref 11.5–15.5)
WBC: 6.7 10*3/uL (ref 4.0–10.5)

## 2012-07-17 MED ORDER — AMLODIPINE BESYLATE 5 MG PO TABS
5.0000 mg | ORAL_TABLET | Freq: Every day | ORAL | Status: DC
  Administered 2012-07-17 – 2012-07-18 (×2): 5 mg via ORAL
  Filled 2012-07-17 (×2): qty 1

## 2012-07-17 MED ORDER — ISOSORBIDE MONONITRATE 15 MG HALF TABLET
15.0000 mg | ORAL_TABLET | Freq: Every day | ORAL | Status: DC
  Administered 2012-07-17 – 2012-07-19 (×3): 15 mg via ORAL
  Filled 2012-07-17 (×3): qty 1

## 2012-07-17 MED ORDER — INSULIN ASPART PROT & ASPART (70-30 MIX) 100 UNIT/ML ~~LOC~~ SUSP
10.0000 [IU] | Freq: Two times a day (BID) | SUBCUTANEOUS | Status: DC
  Administered 2012-07-17 – 2012-07-19 (×4): 10 [IU] via SUBCUTANEOUS
  Filled 2012-07-17: qty 10

## 2012-07-17 MED ORDER — HYDRALAZINE HCL 50 MG PO TABS
50.0000 mg | ORAL_TABLET | Freq: Three times a day (TID) | ORAL | Status: DC
  Administered 2012-07-17 – 2012-07-19 (×6): 50 mg via ORAL
  Filled 2012-07-17 (×9): qty 1

## 2012-07-17 MED ORDER — ATORVASTATIN CALCIUM 20 MG PO TABS
20.0000 mg | ORAL_TABLET | Freq: Every day | ORAL | Status: DC
  Filled 2012-07-17: qty 1

## 2012-07-17 MED ORDER — ATORVASTATIN CALCIUM 40 MG PO TABS
40.0000 mg | ORAL_TABLET | Freq: Every day | ORAL | Status: DC
  Administered 2012-07-17 – 2012-07-18 (×2): 40 mg via ORAL
  Filled 2012-07-17 (×3): qty 1

## 2012-07-17 NOTE — Progress Notes (Signed)
[facility] PROGRESS NOTE  [name] ZOX:[id number] DOB: 04-29-1961 DOA: 07/14/2012 PCP: No primary provider on file.  Assessment/Plan:  #1 chest pain/USA Patient presented with typical chest pain midsternal radiating to the left upper extremity and left neck. Patient with multiple cardiac risk factors and prior history of MI status post stents per patient. Cardiac enzymes have been negative x3. Patient is currently chest pain-free. Fasting lipid panel with LDL of 105.  Patient s/p cardiac catherization with occluded SVG diffusely diseased distal RCA. Continue Coreg, aspirin, digoxin, Lasix, hydralazine. Norvasc added and imdur added per cardiology. Zocor  Changed to atorvastatin.  Cardiology ff and appreciate input and rxcs.   #2 hypoglycemia Patient with hypoglycemic spells improved. Will resume 70/30 at 10 units BID. Sliding scale insulin. Follow.  #3 hypertension Hydralazine increased to 50mg  TID, norvasc started at 5mg  and isosorbide mononitrate. Continue Coreg, digoxin, Lasix, lisinopril.  #4 hyperlipidemia LDL = 105. Statin changed to atorvastatin per cardiology. Goal LDL < 70.   #5 poorly controlled type 2 diabetes Hemoglobin A1c is 12.8. CBGs are range from 116 - 140. Hypoglycemia improved with d/c of long acting insulin. Follow and if CBG elevated will need to resume at much lower dose. Continue Sliding scale insulin.  #6 acute renal failure Questionable etiology. Renal function is  Improving and currently normalized with Cr at 1.12. Follow.  #7 coronary artery disease/history of CHF Patient is currently compensated. See problem #1.  Code Status: Full Family Communication: Updated patient. No family at bedside. Disposition Plan: Home when medically stable   Consultants:  Cardiology: Dr Tresa Endo 07/15/12  Procedures:  Cardiac catherization 07/16/12  Antibiotics: None  HPI/Subjective: Patient denies any chest pain today. No complaints.  Objective: Filed  Vitals:   07/16/12 2303 07/17/12 0700 07/17/12 0836 07/17/12 1014  BP: 180/79 143/64 194/83 151/68  Pulse: 76 62 57 62  Temp:  98.3 F (36.8 C)    TempSrc:      Resp:  18    Height:      Weight:  87.181 kg (192 lb 3.2 oz)    SpO2:  99%      Intake/Output Summary (Last 24 hours) at 07/17/12 1432 Last data filed at 07/17/12 0626  Gross per 24 hour  Intake 1261.8 ml  Output   1700 ml  Net -438.2 ml   Filed Weights   07/14/12 2345 07/17/12 0700  Weight: 89.812 kg (198 lb) 87.181 kg (192 lb 3.2 oz)    Exam:   General:  NAD  Cardiovascular: RRR. No JVD. No lower extremity edema.  Respiratory: CTAB  Abdomen: Soft/NT/ND/+BS   Data Reviewed: Basic Metabolic Panel:  Lab 07/17/12 8119 07/16/12 0607 07/15/12 0525 07/14/12 2352  NA 139 138 139 141  K 3.7 3.8 3.8 3.3*  CL 105 104 102 100  CO2 26 28 27 27   GLUCOSE 174* 176* 222* 116*  BUN 14 17 22 23   CREATININE 1.12 1.19 1.58* 2.00*  CALCIUM 8.7 8.8 8.6 9.3  MG -- -- -- --  PHOS -- -- -- --   Liver Function Tests:  Lab 07/14/12 2352  AST 31  ALT 12  ALKPHOS 59  BILITOT 0.6  PROT 7.6  ALBUMIN 3.9   No results found for this basename: LIPASE:5,AMYLASE:5 in the last 168 hours No results found for this basename: AMMONIA:5 in the last 168 hours CBC:  Lab 07/17/12 0700 07/16/12 0607 07/15/12 0525 07/14/12 2352  WBC 6.7 7.5 14.5* 15.8*  NEUTROABS -- -- -- 11.4*  HGB 11.4*  11.9* 11.4* 12.9*  HCT 35.5* 37.5* 36.1* 40.2  MCV 87.7 89.3 88.7 88.5  PLT 205 213 217 242   Cardiac Enzymes:  Lab 07/15/12 1616 07/15/12 0903 07/15/12 0525 07/14/12 2352  CKTOTAL -- -- -- --  CKMB -- -- -- --  CKMBINDEX -- -- -- --  TROPONINI <0.30 <0.30 <0.30 <0.30   BNP (last 3 results) No results found for this basename: PROBNP:3 in the last 8760 hours CBG:  Lab 07/17/12 1155 07/17/12 0741 07/16/12 2000 07/16/12 1656 07/16/12 1239  GLUCAP 174* 158* 163* 218* 111*    No results found for this or any previous visit (from the  past 240 hour(s)).   Studies: No results found.  Scheduled Meds:    . amLODipine  5 mg Oral Daily  . aspirin  81 mg Oral Daily  . atorvastatin  40 mg Oral q1800  . carvedilol  25 mg Oral BID WC  . digoxin  0.125 mg Oral Daily  . enoxaparin (LOVENOX) injection  90 mg Subcutaneous Q12H  . furosemide  40 mg Oral Daily  . hydrALAZINE  50 mg Oral Q8H  . insulin aspart  0-9 Units Subcutaneous TID WC  . insulin aspart protamine-insulin aspart  10 Units Subcutaneous BID WC  . isosorbide mononitrate  15 mg Oral Daily  . labetalol  10 mg Intravenous Once  . lisinopril  20 mg Oral BID  . sodium chloride  3 mL Intravenous Q12H  . sodium chloride  3 mL Intravenous Q12H  . [DISCONTINUED] atorvastatin  20 mg Oral q1800  . [DISCONTINUED] hydrALAZINE  50 mg Oral BID  . [DISCONTINUED] nitroGLYCERIN  0.5 inch Topical Q6H  . [DISCONTINUED] simvastatin  40 mg Oral QPM   Continuous Infusions:    . [EXPIRED] sodium chloride 1 mL/kg/hr (07/16/12 1813)  . sodium chloride      Principal Problem:  *Chest pain Active Problems:  Hypertension  Diabetes mellitus without complication  CAD (coronary artery disease)  High cholesterol  CHF (congestive heart failure)  S/P CABG x 4, 01/2011-LIMA-LAD; VG-Ramus;VG-OM1; VG-PDA  Renal failure, acute on chronic  Hypoglycemia    Time spent: > 35 mins    [facility]  [facility] Pager [phone]. If 8PM-8AM, please contact night-coverage at [email or web address], password [facility] 07/17/2012, 2:32 PM  LOS: 3 days

## 2012-07-17 NOTE — Progress Notes (Signed)
[facility]  Subjective: Groin is a little tender.  Objective: Vital signs in last 24 hours: Temp:  [98.3 F (36.8 C)-98.5 F (36.9 C)] 98.3 F (36.8 C) (11/09 0700) Pulse Rate:  [55-76] 57  (11/09 0836) Resp:  [16-18] 18  (11/09 0700) BP: (143-202)/(64-94) 194/83 mmHg (11/09 0836) SpO2:  [94 %-100 %] 99 % (11/09 0700) Weight:  [87.181 kg (192 lb 3.2 oz)] 87.181 kg (192 lb 3.2 oz) (11/09 0700) Last BM Date: 07/17/12  Intake/Output from previous day: 11/08 0701 - 11/09 0700 In: 1883.5 [P.O.:960; I.V.:923.5] Out: 1700 [Urine:1700] Intake/Output this shift:    Medications Current Facility-Administered Medications  Medication Dose Route Frequency Provider Last Rate Last Dose  . 0.9 %  sodium chloride infusion  250 mL Intravenous PRN Tarry Kos, MD      . [EXPIRED] 0.9 %  sodium chloride infusion  1 mL/kg/hr Intravenous Continuous Marykay Lex, MD 89.8 mL/hr at 07/16/12 1813 1 mL/kg/hr at 07/16/12 1813  . 0.9 %  sodium chloride infusion  250 mL Intravenous Continuous Marykay Lex, MD      . acetaminophen (TYLENOL) tablet 650 mg  650 mg Oral Q4H PRN Tarry Kos, MD      . acetaminophen (TYLENOL) tablet 650 mg  650 mg Oral Q4H PRN Marykay Lex, MD      . aspirin chewable tablet 81 mg  81 mg Oral Daily Tarry Kos, MD   81 mg at 07/15/12 1137  . carvedilol (COREG) tablet 25 mg  25 mg Oral BID WC Tarry Kos, MD   25 mg at 07/17/12 0837  . dextrose (GLUTOSE) 40 % oral gel 37.5 g  1 Tube Oral PRN Rodolph Bong, MD      . [address] diazepam (VALIUM) tablet 5 mg  5 mg Oral On Call Rodolph Bong, MD   5 mg at 07/16/12 1022  . digoxin (LANOXIN) tablet 0.125 mg  0.125 mg Oral Daily Tarry Kos, MD   0.125 mg at 07/15/12 1138  . enoxaparin (LOVENOX) injection 90 mg  90 mg Subcutaneous Q12H Rodolph Bong, MD   90 mg at 07/17/12 0839  . [COMPLETED] fentaNYL (SUBLIMAZE) 0.05 MG/ML injection           . furosemide (LASIX) tablet 40 mg   40 mg Oral Daily Tarry Kos, MD   40 mg at 07/15/12 1137  . [COMPLETED] heparin 2-0.9 UNIT/ML-% infusion           . [COMPLETED] hydrALAZINE (APRESOLINE) 20 MG/ML injection           . hydrALAZINE (APRESOLINE) injection 10 mg  10 mg Intravenous Q6H PRN Rodolph Bong, MD      . hydrALAZINE (APRESOLINE) tablet 50 mg  50 mg Oral BID Marykay Lex, MD   50 mg at 07/17/12 0837  . insulin aspart (novoLOG) injection 0-9 Units  0-9 Units Subcutaneous TID WC Tarry Kos, MD   2 Units at 07/17/12 [phone]  . insulin aspart protamine-insulin aspart (NOVOLOG 70/30) injection 10 Units  10 Units Subcutaneous BID WC Rodolph Bong, MD      . labetalol (NORMODYNE,TRANDATE) injection 10 mg  10 mg Intravenous Once Marykay Lex, MD      . [address] labetalol (NORMODYNE,TRANDATE) injection 10 mg  10 mg Intravenous Once Marykay Lex, MD   10 mg at 07/16/12 1252  . [COMPLETED] lidocaine (XYLOCAINE) 1 % injection           .  lisinopril (PRINIVIL,ZESTRIL) tablet 20 mg  20 mg Oral BID Tarry Kos, MD   20 mg at 07/16/12 2303  . [COMPLETED] midazolam (VERSED) 2 MG/2ML injection           . nitroGLYCERIN (NITROGLYN) 2 % ointment 0.5 inch  0.5 inch Topical Q6H Tarry Kos, MD   0.5 inch at 07/17/12 0620  . nitroGLYCERIN (NITROSTAT) SL tablet 0.4 mg  0.4 mg Sublingual Q5 Min x 3 PRN Tarry Kos, MD      . [COMPLETED] nitroGLYCERIN (NTG ON-CALL) 0.2 mg/mL injection           . ondansetron (ZOFRAN) injection 4 mg  4 mg Intravenous Q6H PRN Tarry Kos, MD      . ondansetron [facility]) injection 4 mg  4 mg Intravenous Q6H PRN Marykay Lex, MD      . simvastatin (ZOCOR) tablet 40 mg  40 mg Oral QPM Tarry Kos, MD   40 mg at 07/16/12 1803  . sodium chloride 0.9 % injection 3 mL  3 mL Intravenous Q12H Tarry Kos, MD   3 mL at 07/16/12 0938  . sodium chloride 0.9 % injection 3 mL  3 mL Intravenous PRN Tarry Kos, MD      . sodium chloride 0.9 % injection 3 mL  3 mL Intravenous Q12H Marykay Lex, MD   3  mL at 07/16/12 2034  . sodium chloride 0.9 % injection 3 mL  3 mL Intravenous PRN Marykay Lex, MD      . [DISCONTINUED] 0.9 %  sodium chloride infusion  250 mL Intravenous PRN Nada Boozer, NP      . [DISCONTINUED] 0.9 %  sodium chloride infusion  1 mL/kg/hr Intravenous Continuous Nada Boozer, NP 89.8 mL/hr at 07/15/12 2003 1 mL/kg/hr at 07/15/12 2003  . [DISCONTINUED] hydrALAZINE (APRESOLINE) tablet 25 mg  25 mg Oral BID Tarry Kos, MD   25 mg at 07/16/12 0934  . [DISCONTINUED] sodium chloride 0.9 % injection 3 mL  3 mL Intravenous Q12H Nada Boozer, NP      . [DISCONTINUED] sodium chloride 0.9 % injection 3 mL  3 mL Intravenous PRN Nada Boozer, NP        PE: General appearance: alert, cooperative and no distress Lungs: clear to auscultation bilaterally Heart: regular rate and rhythm, S1, S2 normal, 1/6 sys murmur-apex, no click, rub or gallop Extremities: Trace LEE Pulses: 2+ and symmetric Skin: Right groin:  No hematoma, ecchymosis or bruit.   Neurologic: Grossly normal  Lab Results:   Basename 07/17/12 0700 07/16/12 0607 07/15/12 0525  WBC 6.7 7.5 14.5*  HGB 11.4* 11.9* 11.4*  HCT 35.5* 37.5* 36.1*  PLT 205 213 217   BMET  Basename 07/17/12 0700 07/16/12 0607 07/15/12 0525  NA 139 138 139  K 3.7 3.8 3.8  CL 105 104 102  CO2 26 28 27   GLUCOSE 174* 176* 222*  BUN 14 17 22   CREATININE 1.12 1.19 1.58*  CALCIUM 8.7 8.8 8.6   PT/INR  Basename 07/16/12 0607  LABPROT 13.3  INR 1.02   Cholesterol  Basename 07/15/12 0525  CHOL 165   Lipid Panel     Component Value Date/Time   CHOL 165 07/15/2012 0525   TRIG 64 07/15/2012 0525   HDL 47 07/15/2012 0525   CHOLHDL 3.5 07/15/2012 0525   VLDL 13 07/15/2012 0525   LDLCALC 105* 07/15/2012 0525     Cardiac Enzymes No components found with this basename: TROPONIN:3, CKMB:3  Studies/Results: Hemodynamics: Opening blood pressure  was 200/94 mmHg with a mean of 131 mm agree.  Central Aortic Pressure / Mean Aortic  Pressure: Following 20 mg IV hydralazine and sedation space--144/76 mmHg; 102 mmHg  LV Pressure / LV End diastolic Pressure: 140/6 mmHg; EDP 9 mmHg  Left Ventriculography:  EF: 50-55%  Wall Motion: Relatively normal on this 2-D image  Coronary Angiographic Data:  Left Main: Large-caliber vessel which trifurcates into the LAD, Ramus Intermedius and Circumflex. Angiographically normal.  Left Anterior Descending (LAD): Large-caliber vessel that is occluded in the mid vessel. This is after after giving off a major septal perforator followed by a 2 more diagonals and septal perforator. After third septal perforator the vessel the is an occluded with competitive flow. The second tube septal perforators are large in caliber refusing most of the septum.  1st diagonal (D1): Moderate caliber vessel covering large distribution; diffuse luminal irregularities.  2nd diagonal (D2): Small to moderate caliber vessel with ostial 70% lesion. Not favorable PCI target due to 1.5-2 mm vessel  3rd diagonal (D3): Very small caliber vessel, diffusely diseased.  Circumflex (LCx): Large-caliber vessel that is totally occluded at what appears to be at a OM branch. No distal flow not noted distally. The only remaining branch is an atrial branch.  Ramus Intermedius: Large-caliber vessel with ostial 60% lesion. There is an inferior branch is being perfused with competitive flow noted in the main branch. Right Coronary Artery: large-caliber dominant vessel there is diffuse disease in the main vessel with 40% distal stenosis. There is a major RV marginal branch of the genu. The vessel then branches into very small-caliber bifurcating posterior descending artery with a 60-70 cystinosis at the bifurcation point. These vessels are subcutaneous millimeter diameter. The RCA then proceeds into the right posterior AV groove giving rise to several small caliber posterolateral branches. For the first there is a roughly 70% lesion. Again this  is a < 2mm vessel.  Grafts  LIMA - LAD: Widely patent with the distal LAD being diffusely diseased, it wraps the apex.  SVG - OM: Widely patent vessel with antegrade flow filling of a large bifurcating OM system. There is retrograde flow back to the native circumflex but not further downstream.  SVG - RAMUS: Widely patent with antegrade flow diffusing 40 apex. Retrograde flow perfuses back to the left main and down either the circumflex complex versus the other branch of the ramus. At this occasion point there is a severe 90% stenosis but the vessel is roughly 1.5 - 2.0 mm  SVG - RCA (RPDA/RPL): Flush occluded at the aortic ostium Impression:  3 of 4 patent grafts with occluded SVG diffusely diseased distal RCA.  Severe systemic hypertension  Small-caliber diffusely diseased native Coronary Arteries  Well-preserved ejection fraction after initially been diagnosed with Ischemic Cardiomyopathy pre-CABG  No PCI amenable target the treat for chest pain -- potential culprits are all roughly 2 mm diameter or smaller. And in this patient who has limited resources and significant medicine non-adherence, medical therapy should be referred to avoid potential in stent or in PTCA segment stenosis/thrombosis.  Plan:  Standard post cath care with blood pressure control.  I have increase his hydralazine 50 twice a day. We'll need to ensure that he is able to get his medications on regular basis. I have counseled the Case Management assist with this.  Likely etiology of her chest pain is probably uncontrolled hypertension with diffuse distal vessel disease.  He should be stable for discharge for cardiac standpoint once his medication regimen  has been stabilized. This could be either this afternoon or tomorrow morning. He'll need a followup at our office in a few weeks to ensure his blood pressure is well-controlled. The case and results was discussed with the patient.  The case and results was discussed with the  patient's Cardiologist.  Time Spend Directly with Patient:  45 minutes  HARDING,DAVID W, M.D., M.S.  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 3 of 4 patent grafts with occluded SVG diffusely diseased distal RCA. Preserved EF.  BP poorly controlled.  Currently on ASA, carvedilol 25 bid, lasix 40 daily, hydralazine 50bid(increased yesterday), lisinopril 20 BID, Statin.  Case mgt helping with meds.  Will increase hydralazine to 50 TID.   LOS: 3 days    HAGER, BRYAN 07/17/2012 10:06 AM  Patient seen and examined. Agree with assessment and plan. BP remains labile. DC nito oitment and switch to isosorbide mononitrate. Rec add amlodipine at 5 mg and change simvastatin to atorvastatin 40 mg due to dose restriction to 20 mg with simvastatin and amlodipine and LDL still elevated at 105.   Lennette Bihari, MD, [facility] 07/17/2012 10:42 AM

## 2012-07-18 DIAGNOSIS — S301XXA Contusion of abdominal wall, initial encounter: Secondary | ICD-10-CM | POA: Diagnosis not present

## 2012-07-18 DIAGNOSIS — R072 Precordial pain: Secondary | ICD-10-CM

## 2012-07-18 LAB — GLUCOSE, CAPILLARY

## 2012-07-18 MED ORDER — AMLODIPINE BESYLATE 10 MG PO TABS
10.0000 mg | ORAL_TABLET | Freq: Every day | ORAL | Status: DC
  Administered 2012-07-19: 10 mg via ORAL
  Filled 2012-07-18: qty 1

## 2012-07-18 NOTE — Progress Notes (Signed)
  Echocardiogram 2D Echocardiogram has been performed.  [name] 07/18/2012, 2:42 PM

## 2012-07-18 NOTE — Progress Notes (Signed)
[facility]  Subjective: His right groin became very tender after gong to the bathroom last night.  Pain 10/10.  Objective: Vital signs in last 24 hours: Temp:  [98.1 F (36.7 C)-98.8 F (37.1 C)] 98.7 F (37.1 C) (11/10 0600) Pulse Rate:  [57-69] 66  (11/10 0912) Resp:  [15-18] 15  (11/10 0600) BP: (151-186)/(73-90) 175/73 mmHg (11/10 0912) SpO2:  [99 %-100 %] 100 % (11/10 0600) Last BM Date: 07/17/12  Intake/Output from previous day: 11/09 0701 - 11/10 0700 In: 720 [P.O.:720] Out: -  Intake/Output this shift:    Medications Current Facility-Administered Medications  Medication Dose Route Frequency Provider Last Rate Last Dose  . 0.9 %  sodium chloride infusion  250 mL Intravenous PRN Tarry Kos, MD      . 0.9 %  sodium chloride infusion  250 mL Intravenous Continuous Marykay Lex, MD      . acetaminophen (TYLENOL) tablet 650 mg  650 mg Oral Q4H PRN Tarry Kos, MD      . acetaminophen (TYLENOL) tablet 650 mg  650 mg Oral Q4H PRN Marykay Lex, MD      . amLODipine (NORVASC) tablet 5 mg  5 mg Oral Daily Wilburt Finlay, PA   5 mg at 07/18/12 1013  . aspirin chewable tablet 81 mg  81 mg Oral Daily Tarry Kos, MD   81 mg at 07/18/12 1013  . atorvastatin (LIPITOR) tablet 40 mg  40 mg Oral q1800 Wilburt Finlay, PA   40 mg at 07/17/12 1716  . carvedilol (COREG) tablet 25 mg  25 mg Oral BID WC Tarry Kos, MD   25 mg at 07/18/12 0806  . dextrose (GLUTOSE) 40 % oral gel 37.5 g  1 Tube Oral PRN Rodolph Bong, MD      . digoxin Margit Banda) tablet 0.125 mg  0.125 mg Oral Daily Tarry Kos, MD   0.125 mg at 07/18/12 1013  . enoxaparin (LOVENOX) injection 90 mg  90 mg Subcutaneous Q12H Rodolph Bong, MD   90 mg at 07/18/12 0806  . furosemide (LASIX) tablet 40 mg  40 mg Oral Daily Tarry Kos, MD   40 mg at 07/18/12 1013  . hydrALAZINE (APRESOLINE) injection 10 mg  10 mg Intravenous Q6H PRN Rodolph Bong, MD      . hydrALAZINE (APRESOLINE)  tablet 50 mg  50 mg Oral Q8H Wilburt Finlay, PA   50 mg at 07/18/12 0559  . insulin aspart (novoLOG) injection 0-9 Units  0-9 Units Subcutaneous TID WC Tarry Kos, MD   2 Units at 07/17/12 1246  . insulin aspart protamine-insulin aspart (NOVOLOG 70/30) injection 10 Units  10 Units Subcutaneous BID WC Rodolph Bong, MD   10 Units at 07/18/12 0805  . isosorbide mononitrate (IMDUR) 24 hr tablet 15 mg  15 mg Oral Daily Wilburt Finlay, PA   15 mg at 07/18/12 1013  . labetalol (NORMODYNE,TRANDATE) injection 10 mg  10 mg Intravenous Once Marykay Lex, MD      . lisinopril (PRINIVIL,ZESTRIL) tablet 20 mg  20 mg Oral BID Tarry Kos, MD   20 mg at 07/18/12 1012  . nitroGLYCERIN (NITROSTAT) SL tablet 0.4 mg  0.4 mg Sublingual Q5 Min x 3 PRN Tarry Kos, MD      . ondansetron [facility]) injection 4 mg  4 mg Intravenous Q6H PRN Tarry Kos, MD      . ondansetron [facility]) injection 4 mg  4 mg Intravenous Q6H PRN Piedad Climes  Herbie Baltimore, MD      . sodium chloride 0.9 % injection 3 mL  3 mL Intravenous Q12H Tarry Kos, MD   3 mL at 07/18/12 1014  . sodium chloride 0.9 % injection 3 mL  3 mL Intravenous PRN Tarry Kos, MD      . sodium chloride 0.9 % injection 3 mL  3 mL Intravenous Q12H Marykay Lex, MD   3 mL at 07/17/12 1015  . sodium chloride 0.9 % injection 3 mL  3 mL Intravenous PRN Marykay Lex, MD        PE: General appearance: alert, cooperative and no distress Lungs: clear to auscultation bilaterally Heart: regular rate and rhythm, S1, S2 normal, no murmur, click, rub or gallop Extremities: No LEE Pulses: 2+ and symmetric Skin: Right groin;  Severely tender to palpation with abrupt, small hematoma..  Minimal ecchymosis.  + bruit. 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 3 of 4 patent grafts with occluded SVG diffusely diseased distal RCA. Preserved EF. BP poorly controlled. Currently on ASA, carvedilol 25 bid, lasix 40 daily, hydralazine 50Tid(increased yesterday), lisinopril 20 BID, Statin, amlodipine 5, imdur 15. Case mgt helping with meds.   New:  New hematoma.  Will have vascular US check for pseudoaneurysm (PSA).   LOS: 4 days   HAGER, BRYAN 07/18/2012 11:16 AM  I seen and evaluated the patient this afternoon along with Wilburt Finlay, PA.  I agree with their findings, examination as well as impression recommendations.  No further CP.  BP still labile.  With new r sided goin pain - Vasc US done.  I held manual pressure for 25 min -- still a bit sore, but no longer excruciatingly tender.  Suspect a small PSA from systemic HTN dislodging hemostatic plug -- had good hemostasis until last PM. Vasc US result pending.  If feels better tomorrow, may be able to d/c with OP f/u doppler if small PSA, as it may have closed off with manual pressure.   Would also check Renal US as OP.  On Lasix, Hydralazine/Nitrate & Coreg & ACE-I. On Statin   Ronee Ranganathan W, M.D., M.S. [facility] [address] [place], [place]  [address]  [phone] Pager # [phone] 07/18/2012 4:23 PM      Marykay Lex, M.D., M.S. [facility] [address] [place], [place]  [address]  [phone] Pager # [phone] 07/18/2012 4:19 PM

## 2012-07-18 NOTE — Progress Notes (Signed)
[facility] PROGRESS NOTE  Willford Rabideau ZOX:[id number] DOB: Dec 06, 1960 DOA: 07/14/2012 PCP: No primary provider on file.  Assessment/Plan:  #1 chest pain/USA Patient presented with typical chest pain midsternal radiating to the left upper extremity and left neck. Patient with multiple cardiac risk factors and prior history of MI status post stents per patient. Cardiac enzymes have been negative x3. Patient is currently chest pain-free. Fasting lipid panel with LDL of 105.  Patient s/p cardiac catherization with occluded SVG diffusely diseased distal RCA. Continue Coreg, aspirin, digoxin, Lasix, hydralazine. Norvasc added and imdur added per cardiology. Zocor.  Changed to atorvastatin.  Cardiology ff and appreciate input and rxcs.   #2 hypoglycemia Patient with hypoglycemic spells improved. Will resume 70/30 at 10 units BID. Sliding scale insulin. Follow.  #3 hypertension Hydralazine increased to 50mg  TID, norvasc increased to 10 mg today. Continue isosorbide mononitrate, Coreg, digoxin, Lasix, lisinopril.  #4 groin hematoma at the cath site Ultrasound ordered per cardiology to rule out pseudoaneurysm. Per cardiology.  #5 hyperlipidemia LDL = 105. Statin changed to atorvastatin per cardiology. Goal LDL < 70.   #6 poorly controlled type 2 diabetes Hemoglobin A1c is 12.8. CBGs are range from 100 - 157. Hypoglycemia improved with d/c of long acting insulin. 70/30 resumed at 10 units twice daily. Continue Sliding scale insulin.  #7 acute renal failure Questionable etiology. Renal function is  Improving and currently normalized with Cr at 1.12. Follow.  #8 coronary artery disease/history of CHF Patient is currently compensated. See problem #1.  Code Status: Full Family Communication: Updated patient. No family at bedside. Disposition Plan: Home when medically stable   Consultants:  Cardiology: Dr Tresa Endo 07/15/12  Procedures:  Cardiac catherization  07/16/12  Antibiotics: None  HPI/Subjective: Patient denies any chest pain today. Patient complaining of some right groin pain at cath site.  Objective: Filed Vitals:   07/18/12 0600 07/18/12 0803 07/18/12 0912 07/18/12 1129  BP: 166/86 186/90 175/73 157/83  Pulse: 63 61 66 62  Temp: 98.7 F (37.1 C)     TempSrc:      Resp: 15     Height:      Weight:      SpO2: 100%       Intake/Output Summary (Last 24 hours) at 07/18/12 1201 Last data filed at 07/17/12 1900  Gross per 24 hour  Intake    480 ml  Output      0 ml  Net    480 ml   Filed Weights   07/14/12 2345 07/17/12 0700  Weight: 89.812 kg (198 lb) 87.181 kg (192 lb 3.2 oz)    Exam:   General:  NAD  Cardiovascular: RRR. No JVD. No lower extremity edema.  Respiratory: CTAB  Abdomen: Soft/NT/ND/+BS  GU : Right groin severley TTP with small hematoma.   Data Reviewed: Basic Metabolic Panel:  Lab 07/17/12 8119 07/16/12 0607 07/15/12 0525 07/14/12 2352  NA 139 138 139 141  K 3.7 3.8 3.8 3.3*  CL 105 104 102 100  CO2 26 28 27 27   GLUCOSE 174* 176* 222* 116*  BUN 14 17 22 23   CREATININE 1.12 1.19 1.58* 2.00*  CALCIUM 8.7 8.8 8.6 9.3  MG -- -- -- --  PHOS -- -- -- --   Liver Function Tests:  Lab 07/14/12 2352  AST 31  ALT 12  ALKPHOS 59  BILITOT 0.6  PROT 7.6  ALBUMIN 3.9   No results found for this basename: LIPASE:5,AMYLASE:5 in the last 168 hours No results found  for this basename: AMMONIA:5 in the last 168 hours CBC:  Lab 07/17/12 0700 07/16/12 0607 07/15/12 0525 07/14/12 2352  WBC 6.7 7.5 14.5* 15.8*  NEUTROABS -- -- -- 11.4*  HGB 11.4* 11.9* 11.4* 12.9*  HCT 35.5* 37.5* 36.1* 40.2  MCV 87.7 89.3 88.7 88.5  PLT 205 213 217 242   Cardiac Enzymes:  Lab 07/15/12 1616 07/15/12 0903 07/15/12 0525 07/14/12 2352  CKTOTAL -- -- -- --  CKMB -- -- -- --  CKMBINDEX -- -- -- --  TROPONINI <0.30 <0.30 <0.30 <0.30   BNP (last 3 results) No results found for this basename: PROBNP:3 in the  last 8760 hours CBG:  Lab 07/18/12 0741 07/17/12 2045 07/17/12 1652 07/17/12 1155 07/17/12 0741  GLUCAP 157* 100* 193* 174* 158*    No results found for this or any previous visit (from the past 240 hour(s)).   Studies: No results found.  Scheduled Meds:    . amLODipine  10 mg Oral Daily  . aspirin  81 mg Oral Daily  . atorvastatin  40 mg Oral q1800  . carvedilol  25 mg Oral BID WC  . digoxin  0.125 mg Oral Daily  . enoxaparin (LOVENOX) injection  90 mg Subcutaneous Q12H  . furosemide  40 mg Oral Daily  . hydrALAZINE  50 mg Oral Q8H  . insulin aspart  0-9 Units Subcutaneous TID WC  . insulin aspart protamine-insulin aspart  10 Units Subcutaneous BID WC  . isosorbide mononitrate  15 mg Oral Daily  . labetalol  10 mg Intravenous Once  . lisinopril  20 mg Oral BID  . sodium chloride  3 mL Intravenous Q12H  . sodium chloride  3 mL Intravenous Q12H  . [DISCONTINUED] amLODipine  5 mg Oral Daily   Continuous Infusions:    . 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 If 8PM-8AM, please contact night-coverage at [email or web address], password [facility] 07/18/2012, 12:01 PM  LOS: 4 days

## 2012-07-19 DIAGNOSIS — R1900 Intra-abdominal and pelvic swelling, mass and lump, unspecified site: Secondary | ICD-10-CM

## 2012-07-19 LAB — BASIC METABOLIC PANEL
BUN: 14 mg/dL (ref 6–23)
Calcium: 9.1 mg/dL (ref 8.4–10.5)
Creatinine, Ser: 1.16 mg/dL (ref 0.50–1.35)
GFR calc Af Amer: 81 mL/min — ABNORMAL LOW (ref >90)
GFR calc non Af Amer: 70 mL/min — ABNORMAL LOW (ref >90)

## 2012-07-19 LAB — GLUCOSE, CAPILLARY

## 2012-07-19 LAB — CBC
HCT: 35.5 % — ABNORMAL LOW (ref 39.0–52.0)
MCHC: 32.1 g/dL (ref 30.0–36.0)
MCV: 87.7 fL (ref 78.0–100.0)
RDW: 13.1 % (ref 11.5–15.5)

## 2012-07-19 MED ORDER — INSULIN ASPART PROT & ASPART (70-30 MIX) 100 UNIT/ML ~~LOC~~ SUSP: 10.0000 [IU] | Freq: Two times a day (BID) | SUBCUTANEOUS | Status: DC

## 2012-07-19 MED ORDER — ENOXAPARIN SODIUM 40 MG/0.4ML ~~LOC~~ SOLN: 40.0000 mg | SUBCUTANEOUS | Status: DC

## 2012-07-19 MED ORDER — FUROSEMIDE 40 MG PO TABS: 40.0000 mg | ORAL_TABLET | Freq: Every day | ORAL | Status: DC

## 2012-07-19 MED ORDER — HYDRALAZINE HCL 25 MG PO TABS: 75.0000 mg | ORAL_TABLET | Freq: Three times a day (TID) | ORAL | Status: DC

## 2012-07-19 MED ORDER — POTASSIUM CHLORIDE CRYS ER 20 MEQ PO TBCR: 20.0000 meq | EXTENDED_RELEASE_TABLET | Freq: Every day | ORAL | Status: DC

## 2012-07-19 MED ORDER — HYDRALAZINE HCL 50 MG PO TABS
75.0000 mg | ORAL_TABLET | Freq: Three times a day (TID) | ORAL | Status: DC
  Administered 2012-07-19: 75 mg via ORAL
  Filled 2012-07-19 (×3): qty 1

## 2012-07-19 MED ORDER — LISINOPRIL 20 MG PO TABS: 20.0000 mg | ORAL_TABLET | Freq: Two times a day (BID) | ORAL | Status: DC

## 2012-07-19 MED ORDER — CARVEDILOL 25 MG PO TABS: 25.0000 mg | ORAL_TABLET | Freq: Two times a day (BID) | ORAL | Status: DC

## 2012-07-19 MED ORDER — PRAVASTATIN SODIUM 40 MG PO TABS: 40.0000 mg | ORAL_TABLET | Freq: Every day | ORAL | Status: DC

## 2012-07-19 MED ORDER — AMLODIPINE BESYLATE 10 MG PO TABS: 10.0000 mg | ORAL_TABLET | Freq: Every day | ORAL | Status: DC

## 2012-07-19 MED ORDER — POTASSIUM CHLORIDE CRYS ER 20 MEQ PO TBCR
20.0000 meq | EXTENDED_RELEASE_TABLET | Freq: Every day | ORAL | Status: DC
  Administered 2012-07-19: 20 meq via ORAL
  Filled 2012-07-19: qty 1

## 2012-07-19 MED ORDER — NITROGLYCERIN 0.4 MG SL SUBL: 0.4000 mg | SUBLINGUAL_TABLET | SUBLINGUAL | Status: DC | PRN

## 2012-07-19 MED ORDER — DIGOXIN 125 MCG PO TABS: 0.1250 mg | ORAL_TABLET | Freq: Every day | ORAL | Status: DC

## 2012-07-19 MED ORDER — ISOSORBIDE MONONITRATE 15 MG HALF TABLET: 15.0000 mg | ORAL_TABLET | Freq: Every day | ORAL | Status: DC

## 2012-07-19 NOTE — Progress Notes (Signed)
VASCULAR LAB PRELIMINARY  PRELIMINARY  PRELIMINARY  PRELIMINARY  Right groin ultrasound completed.    Preliminary report:  No evidence of pseudoaneurysm or AV fistula.  Small hematoma noted.  Paxton Binns, RVT 07/19/2012, 11:27 AM

## 2012-07-19 NOTE — Progress Notes (Signed)
Pt discharged to home per MD order. Pt alert and oriented at discharge with no complaints of pain.  Pt received and reviewed all discharge instructions and medication information including follow-up appointments and prescriptions.  Pt escorted to private vehicle via wheelchair by nurse tech.  Efraim Kaufmann

## 2012-07-19 NOTE — Progress Notes (Signed)
[facility]  Subjective: Feels good.  Groin pain has resolved.  Objective: Vital signs in last 24 hours: Temp:  [97.9 F (36.6 C)-98.6 F (37 C)] 98.4 F (36.9 C) (11/11 0600) Pulse Rate:  [57-68] 57  (11/11 1000) Resp:  [16-18] 18  (11/11 0600) BP: (151-172)/(74-90) 151/81 mmHg (11/11 1034) SpO2:  [99 %-100 %] 100 % (11/11 0600) Last BM Date: 07/18/12  Intake/Output from previous day: 11/10 0701 - 11/11 0700 In: 360 [P.O.:360] Out: -  Intake/Output this shift:    Medications Current Facility-Administered Medications  Medication Dose Route Frequency Provider Last Rate Last Dose  . 0.9 %  sodium chloride infusion  250 mL Intravenous PRN Tarry Kos, MD      . 0.9 %  sodium chloride infusion  250 mL Intravenous Continuous Marykay Lex, MD      . acetaminophen (TYLENOL) tablet 650 mg  650 mg Oral Q4H PRN Tarry Kos, MD   650 mg at 07/18/12 1310  . acetaminophen (TYLENOL) tablet 650 mg  650 mg Oral Q4H PRN Marykay Lex, MD      . amLODipine (NORVASC) tablet 10 mg  10 mg Oral Daily Wilburt Finlay, PA   10 mg at 07/19/12 1034  . aspirin chewable tablet 81 mg  81 mg Oral Daily Tarry Kos, MD   81 mg at 07/19/12 1034  . atorvastatin (LIPITOR) tablet 40 mg  40 mg Oral q1800 Wilburt Finlay, PA   40 mg at 07/18/12 1731  . carvedilol (COREG) tablet 25 mg  25 mg Oral BID WC Tarry Kos, MD   25 mg at 07/19/12 1610  . dextrose (GLUTOSE) 40 % oral gel 37.5 g  1 Tube Oral PRN Rodolph Bong, MD      . digoxin Margit Banda) tablet 0.125 mg  0.125 mg Oral Daily Tarry Kos, MD   0.125 mg at 07/18/12 1013  . enoxaparin (LOVENOX) injection 90 mg  90 mg Subcutaneous Q12H Rodolph Bong, MD   90 mg at 07/19/12 0809  . furosemide (LASIX) tablet 40 mg  40 mg Oral Daily Tarry Kos, MD   40 mg at 07/19/12 1034  . hydrALAZINE (APRESOLINE) injection 10 mg  10 mg Intravenous Q6H PRN Rodolph Bong, MD      . hydrALAZINE (APRESOLINE) tablet 50 mg  50 mg Oral  Q8H Wilburt Finlay, PA   50 mg at 07/19/12 9604  . insulin aspart (novoLOG) injection 0-9 Units  0-9 Units Subcutaneous TID WC Tarry Kos, MD   5 Units at 07/19/12 0809  . insulin aspart protamine-insulin aspart (NOVOLOG 70/30) injection 10 Units  10 Units Subcutaneous BID WC Rodolph Bong, MD   10 Units at 07/19/12 0809  . isosorbide mononitrate (IMDUR) 24 hr tablet 15 mg  15 mg Oral Daily Wilburt Finlay, PA   15 mg at 07/19/12 1034  . labetalol (NORMODYNE,TRANDATE) injection 10 mg  10 mg Intravenous Once Marykay Lex, MD      . lisinopril (PRINIVIL,ZESTRIL) tablet 20 mg  20 mg Oral BID Tarry Kos, MD   20 mg at 07/19/12 1034  . nitroGLYCERIN (NITROSTAT) SL tablet 0.4 mg  0.4 mg Sublingual Q5 Min x 3 PRN Tarry Kos, MD      . ondansetron [facility]) injection 4 mg  4 mg Intravenous Q6H PRN Tarry Kos, MD      . ondansetron [facility]) injection 4 mg  4 mg Intravenous Q6H PRN Marykay Lex, MD      .  potassium chloride SA (K-DUR,KLOR-CON) CR tablet 20 mEq  20 mEq Oral Daily Rodolph Bong, MD   20 mEq at 07/19/12 1035  . sodium chloride 0.9 % injection 3 mL  3 mL Intravenous Q12H Tarry Kos, MD   3 mL at 07/19/12 1035  . sodium chloride 0.9 % injection 3 mL  3 mL Intravenous PRN Tarry Kos, MD      . sodium chloride 0.9 % injection 3 mL  3 mL Intravenous Q12H Marykay Lex, MD   3 mL at 07/17/12 1015  . sodium chloride 0.9 % injection 3 mL  3 mL Intravenous PRN Marykay Lex, MD        PE: General appearance: alert, cooperative and no distress  Lungs: clear to auscultation bilaterally  Heart: regular rate and rhythm, S1, S2 normal, no murmur, click, rub or gallop  Extremities: No LEE  Pulses: 2+ and symmetric  Skin: Right groin; Nontender tender to palpation, small hematoma.. Minimal ecchymosis. + bruit.  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 3 of 4 patent grafts with occluded SVG diffusely diseased distal RCA. Preserved EF. BP poorly controlled. Currently on ASA, carvedilol 25 bid, lasix 40 daily, hydralazine 50Tid, lisinopril 20 BID, Statin, amlodipine 10(Increased yesterday), imdur 15.    Will titrate hydralazine to 75 TID  No pseudoaneurysm on [place].  Probably OK for DC at this point.  We can evaluate BP as an OP.   LOS: 5 days    HAGER, BRYAN 07/19/2012 12:31 PM  I seen and evaluated the patient this morning along with the Wilburt Finlay, PA. I agree with his findings, examination as well as impression recommendations.  R groin site looks & feels fine - no further pain.  No PSA on doppler - felt better after pressure held yesterday.  BP are not great, but notably better -- Agree with d/c med regimen (for ease of use, could give Hydralazine 100mg  bid).   Will need renal dopplers as OP.  He is scheduled for f/u @ [facility] in 1-2 weeks.  Marykay Lex, M.D., M.S. [facility] [address] [place], [place]  [address]  [phone] Pager # [phone] 07/19/2012 3:07 PM

## 2012-07-19 NOTE — Discharge Summary (Signed)
Physician Discharge Summary  [name] ZOX:[id number] DOB: 1961/07/28 DOA: 07/14/2012  PCP: No primary provider on file.  Admit date: 07/14/2012 Discharge date: 07/19/2012  Time spent: 60 minutes  Recommendations for Outpatient Follow-up:  1. Patient is to follow up with cardiology as outpatient in one to 2 weeks post discharge. On follow up basic metabolic profile needs to be checked to follow up on patient's electrolytes and renal function. Patient will need renal Dopplers done as an outpatient. Patient will also need further titration of his antihypertensive medications. 2. Patient is to follow up with PCP in 1 week. Patient's PCP will need to address patient's diabetes and other chronic medical issues.  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 Got several doses of ntg in ed which relieved pain. Pain free now with ntg paste on. No sob. No le edema. Has h/o chf with ef of 30-40% but stays well controlled with no hospitaliztions since his cabg. Did not experience cp with his MI then, had sob. No recent illnesses no f/n/v/d. No cough.    Hospital Course:  #1 chest pain/[place]  Patient presented with typical chest pain midsternal radiating to the left upper extremity and left neck. Patient with multiple  cardiac risk factors and prior history of MI status post stents per patient. Cardiac enzymes have been negative x3. Patient is currently chest pain-free. Fasting lipid panel with LDL of 105. Patient s/p cardiac catherization with occluded SVG diffusely diseased distal RCA.patient improved clinically during the hospitalization was chest pain-free. He was maintained on  Coreg, aspirin, digoxin, Lasix, hydralazine. Norvasc added and imdur added per cardiology for better blood pressure control. Zocor was changed to atorvastatin. It was felt per cardiology to manage patient medically.  Patient will be discharged home in stable and improved condition with close follow up with cardiology as outpatient. #2 hypoglycemia  During observation patient was noted to have some hypoglycemic spells. Patient's home dose of insulin 7038 was initially changed to half home dose and subsequently discontinued. Patient CBGs improved. Patient was subsequently resumed on 70/30 at 10 units twice daily. He was maintained on sliding scale insulin. Patient will be discharged home on this dose with close follow up with PCP. #3 hypertension  During the hospitalization patient was noted to be hypertensive. Due to patient's chest pain changes in his blood pressure medication were recommended per cardiology. Patient's hydralazine dose was increased to 75mg  TID, norvasc increased to 10 mg. Patient was started on isosorbide mononitrate, and maintained on Coreg, digoxin, Lasix, lisinopril. Patient will followup with cardiology as outpatient for further blood pressure titration. On follow up may consider changing patient's hydralazine to 100 mg twice daily. #4 groin hematoma at the cath site  During hospitalization, patientat some complaints of groin pain after catheterization. Patient was felt per  cardiology with clinical improvement. Ultrasound was done which was negative for pseudoaneurysm. Patient improved clinically we'll follow up with  cardiology as outpatient. #5 hyperlipidemia  LDL = 105. Statin changed to atorvastatin per cardiology. Goal LDL < 70. Due to financial issues patient's October that will be changed to Pravachol on discharge. #6 poorly controlled type 2 diabetes  Hemoglobin A1c is 12.8. CBGs are range from 100 - 157. Hypoglycemia improved with d/c of long acting insulin. 70/30 resumed at 10 units twice daily.   #7 acute renal failure  On admission patient was noted to have a creatinine of 2. The patient was monitored and followed and hydrated gently with improvement in his renal function such that by day of discharge patient's renal function had normalized and his creatinine was 1.16. Patient in need to followup with PCP as outpatient.   The rest of patient's chronic medical issues remained stable throughout the hospitalization and patient will be discharged in stable and improved condition.     Procedures: Cardiac catherization 07/16/12     Consultations:  Cardiology: Dr. Nicki Guadalajara  Discharge Exam: Filed Vitals:   07/19/12 0600 07/19/12 0810 07/19/12 1000 07/19/12 1034  BP: 168/90 172/90  151/81  Pulse: 68 64 57   Temp: 98.4 F (36.9 C)     TempSrc:      Resp: 18     Height:      Weight:      SpO2: 100%       General: NAD Cardiovascular: RRR Respiratory: Soft/NT/ND/+BS  Discharge Instructions  Discharge Orders    Future Orders Please Complete By Expires   Diet - low sodium heart healthy      Increase activity slowly      Discharge instructions      Comments:   Follow up with Dr Tresa Endo in 1 week. Follow up with pcp in 1-2 weeks       Medication List     As of 07/19/2012  2:07 PM    STOP taking these medications         insulin NPH-insulin regular (70-30) 100 UNIT/ML injection   Commonly known as: NOVOLIN 70/30      simvastatin 40 MG tablet   Commonly known as: ZOCOR      TAKE these medications         amLODipine 10 MG tablet   Commonly known as: NORVASC   Take 1  tablet (10 mg total) by mouth daily.      aspirin 81 MG tablet   Take 81 mg by mouth daily.      carvedilol 25 MG tablet   Commonly known as: COREG   Take 1 tablet (25 mg total) by mouth 2 (two) times daily with a meal.      digoxin 0.125 MG tablet   Commonly known as: LANOXIN   Take 1 tablet (0.125 mg total) by mouth at bedtime.      furosemide 40 MG tablet   Commonly known as: LASIX   Take 1 tablet (40 mg total) by mouth daily.      hydrALAZINE 25 MG tablet   Commonly known as: APRESOLINE   Take 3 tablets (75 mg total) by mouth every 8 (eight) hours.      insulin aspart protamine-insulin aspart (70-30) 100 UNIT/ML injection   Commonly known as: NOVOLOG 70/30   Inject 10 Units into the skin 2 (two) times daily with a meal.      isosorbide mononitrate 15 mg Tb24  Commonly known as: IMDUR   Take 0.5 tablets (15 mg total) by mouth daily.      lisinopril 20 MG tablet   Commonly known as: PRINIVIL,ZESTRIL   Take 1 tablet (20 mg total) by mouth 2 (two) times daily.      nitroGLYCERIN 0.4 MG SL tablet   Commonly known as: NITROSTAT   Place 1 tablet (0.4 mg total) under the tongue every 5 (five) minutes x 3 doses as needed for chest pain.      potassium chloride SA 20 MEQ tablet   Commonly known as: K-DUR,KLOR-CON   Take 1 tablet (20 mEq total) by mouth daily.      pravastatin 40 MG tablet   Commonly known as: PRAVACHOL   Take 1 tablet (40 mg total) by mouth daily.           Follow-up Information    Follow up with KELLY,Ladarion A, MD. Schedule an appointment as soon as possible for a visit in 1 week.   Contact information:   [address] [place] [address] [phone]       Please follow up. (f/u with PCP in 1-2 weeks)           The results of significant diagnostics from this hospitalization (including imaging, microbiology, ancillary and laboratory) are listed below for reference.    Significant Diagnostic Studies: Dg Chest 2  View  07/15/2012  *RADIOLOGY REPORT*  Clinical Data: Chest pain.  Bilateral upper extremity pain.  Prior CABG.  History of hypertension and diabetes.  CHEST - 2 VIEW  Comparison: Two-view chest x-ray 02/20/2011, 01/29/2011, 07/21/2010.  Findings: Prior sternotomy for CABG.  Cardiac silhouette mildly enlarged.  Thoracic aorta mildly atherosclerotic, unchanged.  Hilar and mediastinal contours otherwise unremarkable.  Scarring in the right lower lobe, unchanged.  No new pulmonary parenchymal abnormalities.  No pleural effusions.  Degenerative changes involving the thoracic spine with slight thoracic scoliosis convex right, unchanged.  IMPRESSION: Stable mild cardiomegaly.  No acute cardiopulmonary disease.   Original Report Authenticated By: Hulan Saas, M.D.     Microbiology: No results found for this or any previous visit (from the past 240 hour(s)).   Labs: Basic Metabolic Panel:  Lab 07/19/12 2130 07/17/12 0700 07/16/12 0607 07/15/12 0525 07/14/12 2352  NA 141 139 138 139 141  K 3.5 3.7 3.8 3.8 3.3*  CL 103 105 104 102 100  CO2 29 26 28 27 27   GLUCOSE 127* 174* 176* 222* 116*  BUN 14 14 17 22 23   CREATININE 1.16 1.12 1.19 1.58* 2.00*  CALCIUM 9.1 8.7 8.8 8.6 9.3  MG -- -- -- -- --  PHOS -- -- -- -- --   Liver Function Tests:  Lab 07/14/12 2352  AST 31  ALT 12  ALKPHOS 59  BILITOT 0.6  PROT 7.6  ALBUMIN 3.9   No results found for this basename: LIPASE:5,AMYLASE:5 in the last 168 hours No results found for this basename: AMMONIA:5 in the last 168 hours CBC:  Lab 07/19/12 0525 07/17/12 0700 07/16/12 0607 07/15/12 0525 07/14/12 2352  WBC 7.4 6.7 7.5 14.5* 15.8*  NEUTROABS -- -- -- -- 11.4*  HGB 11.4* 11.4* 11.9* 11.4* 12.9*  HCT 35.5* 35.5* 37.5* 36.1* 40.2  MCV 87.7 87.7 89.3 88.7 88.5  PLT 217 205 213 217 242   Cardiac Enzymes:  Lab 07/15/12 1616 07/15/12 0903 07/15/12 0525 07/14/12 2352  CKTOTAL -- -- -- --  CKMB -- -- -- --  CKMBINDEX -- -- -- --  TROPONINI  <0.30 <0.30 <0.30 <0.30   BNP: BNP (last 3 results) No results found for this basename: PROBNP:3 in the last 8760 hours CBG:  Lab 07/19/12 1129 07/19/12 0732 07/18/12 2040 07/18/12 1644 07/18/12 1142  GLUCAP 113* 176* 206* 155* 148*       Signed:  THOMPSON,DANIEL  [facility] 07/19/2012, 2:07 PM

## 2012-07-20 LAB — GLUCOSE, CAPILLARY

## 2012-09-01 ENCOUNTER — Encounter (HOSPITAL_BASED_OUTPATIENT_CLINIC_OR_DEPARTMENT_OTHER): Payer: Self-pay | Admitting: Emergency Medicine

## 2012-09-01 ENCOUNTER — Inpatient Hospital Stay (HOSPITAL_BASED_OUTPATIENT_CLINIC_OR_DEPARTMENT_OTHER)
Admission: EM | Admit: 2012-09-01 | Discharge: 2012-09-07 | DRG: 440 | Disposition: A | Discharge status: Home / Self Care | Payer: Medicaid Other | Attending: Internal Medicine | Admitting: Internal Medicine

## 2012-09-01 DIAGNOSIS — F411 Generalized anxiety disorder: Secondary | ICD-10-CM | POA: Diagnosis present

## 2012-09-01 DIAGNOSIS — E1169 Type 2 diabetes mellitus with other specified complication: Secondary | ICD-10-CM | POA: Diagnosis present

## 2012-09-01 DIAGNOSIS — I509 Heart failure, unspecified: Secondary | ICD-10-CM | POA: Diagnosis present

## 2012-09-01 DIAGNOSIS — N289 Disorder of kidney and ureter, unspecified: Secondary | ICD-10-CM | POA: Diagnosis present

## 2012-09-01 DIAGNOSIS — I129 Hypertensive chronic kidney disease with stage 1 through stage 4 chronic kidney disease, or unspecified chronic kidney disease: Secondary | ICD-10-CM | POA: Diagnosis present

## 2012-09-01 DIAGNOSIS — I2510 Atherosclerotic heart disease of native coronary artery without angina pectoris: Secondary | ICD-10-CM

## 2012-09-01 DIAGNOSIS — Z79899 Other long term (current) drug therapy: Secondary | ICD-10-CM

## 2012-09-01 DIAGNOSIS — Z951 Presence of aortocoronary bypass graft: Secondary | ICD-10-CM

## 2012-09-01 DIAGNOSIS — Z8673 Personal history of transient ischemic attack (TIA), and cerebral infarction without residual deficits: Secondary | ICD-10-CM

## 2012-09-01 DIAGNOSIS — I252 Old myocardial infarction: Secondary | ICD-10-CM

## 2012-09-01 DIAGNOSIS — E876 Hypokalemia: Secondary | ICD-10-CM | POA: Diagnosis present

## 2012-09-01 DIAGNOSIS — N189 Chronic kidney disease, unspecified: Secondary | ICD-10-CM | POA: Diagnosis present

## 2012-09-01 DIAGNOSIS — Z7902 Long term (current) use of antithrombotics/antiplatelets: Secondary | ICD-10-CM

## 2012-09-01 DIAGNOSIS — E7800 Pure hypercholesterolemia, unspecified: Secondary | ICD-10-CM | POA: Diagnosis present

## 2012-09-01 DIAGNOSIS — K859 Acute pancreatitis without necrosis or infection, unspecified: Principal | ICD-10-CM

## 2012-09-01 DIAGNOSIS — Z794 Long term (current) use of insulin: Secondary | ICD-10-CM

## 2012-09-01 DIAGNOSIS — I10 Essential (primary) hypertension: Secondary | ICD-10-CM

## 2012-09-01 DIAGNOSIS — F17200 Nicotine dependence, unspecified, uncomplicated: Secondary | ICD-10-CM | POA: Diagnosis present

## 2012-09-01 DIAGNOSIS — E162 Hypoglycemia, unspecified: Secondary | ICD-10-CM

## 2012-09-01 DIAGNOSIS — R079 Chest pain, unspecified: Secondary | ICD-10-CM

## 2012-09-01 DIAGNOSIS — Z9089 Acquired absence of other organs: Secondary | ICD-10-CM

## 2012-09-01 DIAGNOSIS — E119 Type 2 diabetes mellitus without complications: Secondary | ICD-10-CM | POA: Diagnosis present

## 2012-09-01 DIAGNOSIS — Z7982 Long term (current) use of aspirin: Secondary | ICD-10-CM

## 2012-09-01 DIAGNOSIS — F3289 Other specified depressive episodes: Secondary | ICD-10-CM | POA: Diagnosis present

## 2012-09-01 DIAGNOSIS — F329 Major depressive disorder, single episode, unspecified: Secondary | ICD-10-CM | POA: Diagnosis present

## 2012-09-01 DIAGNOSIS — S301XXA Contusion of abdominal wall, initial encounter: Secondary | ICD-10-CM

## 2012-09-01 HISTORY — DX: Cerebral infarction, unspecified: I63.9

## 2012-09-01 HISTORY — DX: Acute myocardial infarction, unspecified: I21.9

## 2012-09-01 HISTORY — DX: Angina pectoris, unspecified: I20.9

## 2012-09-01 LAB — LIPASE, BLOOD: Lipase: 2757 U/L — ABNORMAL HIGH (ref 11–59)

## 2012-09-01 LAB — COMPREHENSIVE METABOLIC PANEL
Albumin: 3.5 g/dL (ref 3.5–5.2)
BUN: 18 mg/dL (ref 6–23)
CO2: 30 mEq/L (ref 19–32)
Chloride: 99 mEq/L (ref 96–112)
Creatinine, Ser: 1.4 mg/dL — ABNORMAL HIGH (ref 0.50–1.35)
GFR calc Af Amer: 65 mL/min — ABNORMAL LOW (ref >90)
GFR calc non Af Amer: 56 mL/min — ABNORMAL LOW (ref >90)
Total Bilirubin: 0.2 mg/dL — ABNORMAL LOW (ref 0.3–1.2)

## 2012-09-01 LAB — CBC WITH DIFFERENTIAL/PLATELET
Basophils Relative: 0 % (ref 0–1)
HCT: 38.5 % — ABNORMAL LOW (ref 39.0–52.0)
Hemoglobin: 12.8 g/dL — ABNORMAL LOW (ref 13.0–17.0)
MCHC: 33.2 g/dL (ref 30.0–36.0)
Monocytes Absolute: 0.7 10*3/uL (ref 0.1–1.0)
Monocytes Relative: 9 % (ref 3–12)
Neutro Abs: 4.1 10*3/uL (ref 1.7–7.7)

## 2012-09-01 MED ORDER — ONDANSETRON HCL 4 MG/2ML IJ SOLN
4.0000 mg | Freq: Once | INTRAMUSCULAR | Status: AC
  Administered 2012-09-01: 4 mg via INTRAVENOUS
  Filled 2012-09-01: qty 2

## 2012-09-01 MED ORDER — HYDROMORPHONE HCL PF 1 MG/ML IJ SOLN
2.0000 mg | Freq: Once | INTRAMUSCULAR | Status: AC
  Administered 2012-09-01: 2 mg via INTRAVENOUS
  Filled 2012-09-01: qty 2

## 2012-09-01 MED ORDER — SODIUM CHLORIDE 0.9 % IV SOLN
INTRAVENOUS | Status: DC
  Administered 2012-09-01: 23:00:00 via INTRAVENOUS

## 2012-09-01 NOTE — ED Provider Notes (Signed)
History     CSN: [id number]  Arrival date & time 09/01/12  2204   First MD Initiated Contact with Patient 09/01/12 2257      Chief Complaint  Patient presents with  . Abdominal Pain    (Consider location/radiation/quality/duration/timing/severity/associated sxs/prior treatment) HPI Complains of epigastric pain onset at 4 PM on 08/31/2012 gradual onset progressively worsening feels like pain that he had "when I had a gangrenous gallbladder" pain is improved with walking worse with lying in certain positions patient admits to subjective fever yesterday no treatment prior to coming here accompanying symptoms include nausea. He atemeals today. Last bowel movement yesterday, normal.  Past Medical History  Diagnosis Date  . CHF (congestive heart failure)   . Hypertension   . Diabetes mellitus without complication   . High cholesterol   . CAD (coronary artery disease)   . Depression   . Anxiety    patient admits to noncompliance with medications for the past 2 days he has run out  Past Surgical History  Procedure Date  . Cholecystectomy   . Coronary artery bypass graft   . Cholecystectomy     History reviewed. No pertinent family history.  History  Substance Use Topics  . Smoking status: Current Every Day Smoker -- 0.5 packs/day  . Smokeless tobacco: Never Used  . Alcohol Use: No   Occasional alcohol last alcohol 2 days ago   Review of Systems  Constitutional: Positive for fever.  Gastrointestinal: Positive for nausea and abdominal pain.    Allergies  Review of patient's allergies indicates no known allergies.  Home Medications   Current Outpatient Rx  Name  Route  Sig  Dispense  Refill  . AMLODIPINE BESYLATE 10 MG PO TABS   Oral   Take 1 tablet (10 mg total) by mouth daily.   31 tablet   0   . ASPIRIN 81 MG PO TABS   Oral   Take 81 mg by mouth daily.         Marland Kitchen CARVEDILOL 25 MG PO TABS   Oral   Take 1 tablet (25 mg total) by mouth 2 (two) times daily  with a meal.   62 tablet   0   . DIGOXIN 0.125 MG PO TABS   Oral   Take 1 tablet (0.125 mg total) by mouth at bedtime.   31 tablet   0   . FUROSEMIDE 40 MG PO TABS   Oral   Take 1 tablet (40 mg total) by mouth daily.   30 tablet   0   . HYDRALAZINE HCL 25 MG PO TABS   Oral   Take 3 tablets (75 mg total) by mouth every 8 (eight) hours.   270 tablet   0   . INSULIN ASPART PROT & ASPART (70-30) 100 UNIT/ML [place] SUSP   Subcutaneous   Inject 10 Units into the skin 2 (two) times daily with a meal.   10 mL   0   . ISOSORBIDE MONONITRATE 15 MG HALF TABLET   Oral   Take 0.5 tablets (15 mg total) by mouth daily.   30 tablet   0   . LISINOPRIL 20 MG PO TABS   Oral   Take 1 tablet (20 mg total) by mouth 2 (two) times daily.   62 tablet   0   . POTASSIUM CHLORIDE CRYS ER 20 MEQ PO TBCR   Oral   Take 1 tablet (20 mEq total) by mouth daily.   30 tablet  0   . PRAVASTATIN SODIUM 40 MG PO TABS   Oral   Take 1 tablet (40 mg total) by mouth daily.   30 tablet   0   . NITROGLYCERIN 0.4 MG SL SUBL   Sublingual   Place 1 tablet (0.4 mg total) under the tongue every 5 (five) minutes x 3 doses as needed for chest pain.   15 tablet   0     BP 168/90  Pulse 66  Temp 98.3 F (36.8 C) (Oral)  Resp 18  Ht 6\' 2"  (1.88 m)  Wt 200 lb (90.719 kg)  BMI 25.68 kg/m2  SpO2 98%  Physical Exam  Nursing note and vitals reviewed. Constitutional: He appears well-developed and well-nourished.  HENT:  Head: Normocephalic and atraumatic.  Eyes: Conjunctivae normal are normal. Pupils are equal, round, and reactive to light.  Neck: Neck supple. No tracheal deviation present. No thyromegaly present.  Cardiovascular: Normal rate and regular rhythm.   No murmur heard. Pulmonary/Chest: Effort normal and breath sounds normal.  Abdominal: Soft. Bowel sounds are normal. He exhibits no distension and no mass. There is tenderness. There is no rebound and no guarding.       Epigastric  tenderness, left upper quadrant tenderness  Genitourinary: Penis normal.       No flank tenderness  Musculoskeletal: Normal range of motion. He exhibits no edema and no tenderness.  Neurological: He is alert. Coordination normal.  Skin: Skin is warm and dry. No rash noted.  Psychiatric: He has a normal mood and affect.    ED Course  Procedures (including critical care time)  Date: 09/01/2012  Rate: 60  Rhythm: normal sinus rhythm  QRS Axis: left  Intervals: normal  ST/T Wave abnormalities: Strain pattern  Conduction Disutrbances:none  Narrative Interpretation: Poor R-wave progression  Old EKG Reviewed: No significant change from 07/16/2012 interpreted by me  Labs Reviewed  CBC WITH DIFFERENTIAL - Abnormal; Notable for the following:    Hemoglobin 12.8 (*)     HCT 38.5 (*)     All other components within normal limits  COMPREHENSIVE METABOLIC PANEL  LIPASE, BLOOD   No results found.   No diagnosis found. 11:20 PM feels much improved after treatment with intravenous hydromorphone and Zofran  Results for orders placed during the hospital encounter of 09/01/12  CBC WITH DIFFERENTIAL      Component Value Range   WBC 7.8  4.0 - 10.5 K/uL   RBC 4.44  4.22 - 5.81 MIL/uL   Hemoglobin 12.8 (*) 13.0 - 17.0 g/dL   HCT 16.1 (*) 09.6 - 04.5 %   MCV 86.7  78.0 - 100.0 fL   MCH 28.8  26.0 - 34.0 pg   MCHC 33.2  30.0 - 36.0 g/dL   RDW 40.9  81.1 - 91.4 %   Platelets 195  150 - 400 K/uL   Neutrophils Relative 52  43 - 77 %   Neutro Abs 4.1  1.7 - 7.7 K/uL   Lymphocytes Relative 35  12 - 46 %   Lymphs Abs 2.7  0.7 - 4.0 K/uL   Monocytes Relative 9  3 - 12 %   Monocytes Absolute 0.7  0.1 - 1.0 K/uL   Eosinophils Relative 4  0 - 5 %   Eosinophils Absolute 0.3  0.0 - 0.7 K/uL   Basophils Relative 0  0 - 1 %   Basophils Absolute 0.0  0.0 - 0.1 K/uL  COMPREHENSIVE METABOLIC PANEL      Component  Value Range   Sodium 137  135 - 145 mEq/L   Potassium 4.2  3.5 - 5.1 mEq/L   Chloride  99  96 - 112 mEq/L   CO2 30  19 - 32 mEq/L   Glucose, Bld 358 (*) 70 - 99 mg/dL   BUN 18  6 - 23 mg/dL   Creatinine, Ser 4.09 (*) 0.50 - 1.35 mg/dL   Calcium 9.6  8.4 - 81.1 mg/dL   Total Protein 7.7  6.0 - 8.3 g/dL   Albumin 3.5  3.5 - 5.2 g/dL   AST 14  0 - 37 U/L   ALT 12  0 - 53 U/L   Alkaline Phosphatase 60  39 - 117 U/L   Total Bilirubin 0.2 (*) 0.3 - 1.2 mg/dL   GFR calc non Af Amer 56 (*) >90 mL/min   GFR calc Af Amer 65 (*) >90 mL/min  LIPASE, BLOOD      Component Value Range   Lipase 2757 (*) 11 - 59 U/L   No results found.  MDM  Spoke with Dr. Julian Reil plan transfer [facility] hospital, patient remain n.p.o., IV fluids, analgesia Diagnosis #1 acute pancreatitis #2 hyperglycemia #3 renal insufficiency       Doug Sou, MD 09/01/12 2348

## 2012-09-01 NOTE — ED Notes (Signed)
MD at bedside. 

## 2012-09-01 NOTE — ED Notes (Signed)
Abdominal pain mid epigastric region that started suddenly yesterday and has progressively gotten worse

## 2012-09-02 ENCOUNTER — Inpatient Hospital Stay (HOSPITAL_COMMUNITY): Payer: Medicaid Other

## 2012-09-02 ENCOUNTER — Encounter (HOSPITAL_COMMUNITY): Payer: Self-pay

## 2012-09-02 DIAGNOSIS — N289 Disorder of kidney and ureter, unspecified: Secondary | ICD-10-CM | POA: Diagnosis present

## 2012-09-02 DIAGNOSIS — E119 Type 2 diabetes mellitus without complications: Secondary | ICD-10-CM

## 2012-09-02 DIAGNOSIS — K859 Acute pancreatitis without necrosis or infection, unspecified: Principal | ICD-10-CM

## 2012-09-02 LAB — LIPASE, BLOOD: Lipase: 524 U/L — ABNORMAL HIGH (ref 11–59)

## 2012-09-02 LAB — BASIC METABOLIC PANEL
BUN: 19 mg/dL (ref 6–23)
BUN: 19 mg/dL (ref 6–23)
Calcium: 8.5 mg/dL (ref 8.4–10.5)
Chloride: 106 mEq/L (ref 96–112)
Creatinine, Ser: 1.28 mg/dL (ref 0.50–1.35)
GFR calc non Af Amer: 62 mL/min — ABNORMAL LOW (ref >90)
Glucose, Bld: 121 mg/dL — ABNORMAL HIGH (ref 70–99)
Glucose, Bld: 197 mg/dL — ABNORMAL HIGH (ref 70–99)
Potassium: 4.2 mEq/L (ref 3.5–5.1)
Potassium: 3.9 mEq/L (ref 3.5–5.1)

## 2012-09-02 LAB — CBC
HCT: 32.6 % — ABNORMAL LOW (ref 39.0–52.0)
Hemoglobin: 10.7 g/dL — ABNORMAL LOW (ref 13.0–17.0)
MCH: 28.2 pg (ref 26.0–34.0)
MCHC: 32.8 g/dL (ref 30.0–36.0)
MCV: 85.8 fL (ref 78.0–100.0)
RDW: 12.8 % (ref 11.5–15.5)

## 2012-09-02 LAB — LIPID PANEL
Cholesterol: 141 mg/dL (ref 0–200)
LDL Cholesterol: 76 mg/dL (ref 0–99)
Total CHOL/HDL Ratio: 2.8 RATIO
VLDL: 14 mg/dL (ref 0–40)

## 2012-09-02 LAB — URINALYSIS, ROUTINE W REFLEX MICROSCOPIC
Ketones, ur: NEGATIVE mg/dL
Leukocytes, UA: NEGATIVE
Nitrite: NEGATIVE
Protein, ur: 30 mg/dL — AB
Urobilinogen, UA: 1 mg/dL (ref 0.0–1.0)

## 2012-09-02 LAB — GLUCOSE, CAPILLARY: Glucose-Capillary: 204 mg/dL — ABNORMAL HIGH (ref 70–99)

## 2012-09-02 LAB — URINE MICROSCOPIC-ADD ON

## 2012-09-02 MED ORDER — DEXTROSE-NACL 5-0.9 % IV SOLN
INTRAVENOUS | Status: DC
  Filled 2012-09-02 (×3): qty 1000

## 2012-09-02 MED ORDER — DEXTROSE 50 % IV SOLN: 50.0000 mL | Freq: Once | INTRAVENOUS | Status: AC | PRN

## 2012-09-02 MED ORDER — DIGOXIN 125 MCG PO TABS
0.1250 mg | ORAL_TABLET | Freq: Every day | ORAL | Status: DC
  Administered 2012-09-02 – 2012-09-06 (×5): 0.125 mg via ORAL
  Filled 2012-09-02 (×6): qty 1

## 2012-09-02 MED ORDER — DEXTROSE-NACL 5-0.9 % IV SOLN
INTRAVENOUS | Status: DC
  Administered 2012-09-02: 11:00:00 via INTRAVENOUS

## 2012-09-02 MED ORDER — DEXTROSE 50 % IV SOLN: 25.0000 mL | Freq: Once | INTRAVENOUS | Status: AC | PRN

## 2012-09-02 MED ORDER — NITROGLYCERIN 0.4 MG SL SUBL: 0.4000 mg | SUBLINGUAL_TABLET | SUBLINGUAL | Status: DC | PRN

## 2012-09-02 MED ORDER — ISOSORBIDE MONONITRATE 15 MG HALF TABLET
15.0000 mg | ORAL_TABLET | Freq: Every day | ORAL | Status: DC
  Administered 2012-09-02 – 2012-09-07 (×6): 15 mg via ORAL
  Filled 2012-09-02 (×6): qty 1

## 2012-09-02 MED ORDER — AMLODIPINE BESYLATE 10 MG PO TABS
10.0000 mg | ORAL_TABLET | Freq: Every day | ORAL | Status: DC
  Administered 2012-09-02 – 2012-09-07 (×6): 10 mg via ORAL
  Filled 2012-09-02 (×6): qty 1

## 2012-09-02 MED ORDER — CARVEDILOL 25 MG PO TABS
25.0000 mg | ORAL_TABLET | Freq: Two times a day (BID) | ORAL | Status: DC
  Administered 2012-09-02 – 2012-09-07 (×11): 25 mg via ORAL
  Filled 2012-09-02 (×13): qty 1

## 2012-09-02 MED ORDER — ASPIRIN 81 MG PO CHEW
81.0000 mg | CHEWABLE_TABLET | Freq: Every day | ORAL | Status: DC
  Administered 2012-09-02 – 2012-09-07 (×6): 81 mg via ORAL
  Filled 2012-09-02 (×5): qty 1

## 2012-09-02 MED ORDER — DEXTROSE 50 % IV SOLN
INTRAVENOUS | Status: AC
  Administered 2012-09-02: 50 mL
  Filled 2012-09-02: qty 50

## 2012-09-02 MED ORDER — INSULIN ASPART 100 UNIT/ML ~~LOC~~ SOLN
0.0000 [IU] | SUBCUTANEOUS | Status: DC
  Administered 2012-09-02: 15 [IU] via SUBCUTANEOUS
  Administered 2012-09-02: 2 [IU] via SUBCUTANEOUS
  Administered 2012-09-02: 5 [IU] via SUBCUTANEOUS
  Administered 2012-09-03 (×2): 3 [IU] via SUBCUTANEOUS

## 2012-09-02 MED ORDER — HYDRALAZINE HCL 50 MG PO TABS
75.0000 mg | ORAL_TABLET | Freq: Three times a day (TID) | ORAL | Status: DC
  Administered 2012-09-02 – 2012-09-07 (×17): 75 mg via ORAL
  Filled 2012-09-02 (×19): qty 1

## 2012-09-02 MED ORDER — SIMVASTATIN 20 MG PO TABS
20.0000 mg | ORAL_TABLET | Freq: Every day | ORAL | Status: DC
  Administered 2012-09-02 – 2012-09-06 (×5): 20 mg via ORAL
  Filled 2012-09-02 (×6): qty 1

## 2012-09-02 MED ORDER — HYDROMORPHONE HCL PF 1 MG/ML IJ SOLN: 1.0000 mg | INTRAMUSCULAR | Status: DC | PRN

## 2012-09-02 MED ORDER — ONDANSETRON HCL 4 MG/2ML IJ SOLN: 4.0000 mg | Freq: Three times a day (TID) | INTRAMUSCULAR | Status: AC | PRN

## 2012-09-02 MED ORDER — SODIUM CHLORIDE 0.9 % IJ SOLN
3.0000 mL | Freq: Two times a day (BID) | INTRAMUSCULAR | Status: DC
  Administered 2012-09-02 – 2012-09-06 (×9): 3 mL via INTRAVENOUS

## 2012-09-02 MED ORDER — SODIUM CHLORIDE 0.9 % IV SOLN: INTRAVENOUS | Status: DC

## 2012-09-02 MED ORDER — HYDROMORPHONE HCL PF 1 MG/ML IJ SOLN
1.0000 mg | INTRAMUSCULAR | Status: DC | PRN
  Administered 2012-09-02 (×2): 1 mg via INTRAVENOUS
  Filled 2012-09-02 (×2): qty 1

## 2012-09-02 NOTE — Progress Notes (Signed)
Inpatient Diabetes Program Recommendations  AACE/ADA: New Consensus Statement on Inpatient Glycemic Control (2013)  Target Ranges:  Prepandial:   less than 140 mg/dL      Peak postprandial:   less than 180 mg/dL (1-2 hours)      Critically ill patients:  140 - 180 mg/dL   Reason for Visit: Results for [name], [name] (MRN [id number]) as of 09/02/2012 13:09  Ref. Range 09/02/2012 06:27 09/02/2012 11:03 09/02/2012 11:34  Glucose-Capillary Latest Range: 70-99 mg/dL 409 (H) 48 (L) 811 (H)   Note patient admitted with acute pancreatitis.  CBG dropped after he received 15 units of Novolog correction.  Please consider decreasing Novolog correction to sensitive q 4 hours.  Also consider adding Lantus 5 units daily.

## 2012-09-02 NOTE — Progress Notes (Signed)
[facility] PROGRESS NOTE  [name] [name]:[id number] DOB: 08/08/1961 DOA: 09/01/2012 PCP: No primary provider on file.  Assessment/Plan: Acute pancreatitis. No clear etiology. Patient informs of drinking occasionally. No medications that could contribute 2 acute pancreatitis. Patient has had history of cholecystectomy. Right upper quadrant ultrasound does not show any CBD stones. However does have some pancreatic edema suggestive of early pancreatitis with edema.. Below panus CT of the abdomen to further evaluate. followup with lipase levels serially. Continue n.p.o. for today. Pain control as needed. We'll monitor serial abdominal exam. If clinically improved we'll start him on clears from tomorrow.  Diabetes mellitus with hypoglycemia. Patient has been n.p.o. since admission and was hypoglycemic to 48 this morning. Was given a dose of D50 and improved. We will switch IV fluids to D5 with normal saline at 100 cc per hour. Monitor CBG every 4 hours. Hold home dose of insulin. -Of note patient had hypoglycemic spells on his recent hospitalization and his insulin dose was adjusted -His last hemoglobin A1c was 12.8  CHF -History of CAD with CABG Currently euvolemic. Continue digoxin, Lisinopril, Coreg aspirin and Lasix. Continue Imdur Recent echo 6 weeks back showing EF of 60-65% with grade 2 diastolic dysfunction. For CHF symptoms while on IV fluids. -[facility] follows with [facility]. He was admitted to the hospital 6 weeks back and had a cardiac cath for chest pain. -Continue Zocor  CK D. Appears at baseline. Continue to monitor    DVT prophylaxis Code Status: Full Family Communication: None at bedside Disposition Plan: Home once stable   Consultants:  None  Procedures:  None  Antibiotics:  None  HPI/Subjective: Patient noted to be hypoglycemic  To 48 after returning from the ultrasound. He was lethargic. Also was very diabetes. Given D50 and  patient awake and alert. Denies any symptoms except for some epigastric pain. IV fluid changed to D5 NS and labs and EKG ordered stat .  Objective: Filed Vitals:   09/02/12 0004 09/02/12 0207 09/02/12 0224 09/02/12 0938  BP: 142/76  162/83 139/70  Pulse: 62   60  Temp:   97.3 F (36.3 C) 97.8 F (36.6 C)  TempSrc:   Oral   Resp:   18 18  Height:  6\' 2"  (1.88 m)    Weight:  88.1 kg (194 lb 3.6 oz)    SpO2: 99%  99% 100%    Intake/Output Summary (Last 24 hours) at 09/02/12 1145 Last data filed at 09/02/12 0858  Gross per 24 hour  Intake      0 ml  Output      0 ml  Net      0 ml   Filed Weights   09/01/12 2214 09/02/12 0207  Weight: 90.719 kg (200 lb) 88.1 kg (194 lb 3.6 oz)    Exam:   General: Middle-aged male lying in bed appears lethargic and diaphoretic  HEENT: No pallor, dry oral mucosa.  Cardiovascular: Normal S1 and S2, no murmurs rub or gallop  Respiratory: Her to auscultation bilaterally, no added sounds  Abdomen: Mild epigastric tenderness. Nondistended, bowel sounds present  Extremities: Warm and sweaty, no edema  CNS: AAOX3  Data Reviewed: Basic Metabolic Panel:  Lab 09/01/12 5638  NA 137  K 4.2  CL 99  CO2 30  GLUCOSE 358*  BUN 18  CREATININE 1.40*  CALCIUM 9.6  MG --  PHOS --   Liver Function Tests:  Lab 09/01/12 2228  AST 14  ALT 12  ALKPHOS 60  BILITOT 0.2*  PROT 7.7  ALBUMIN 3.5    Lab 09/01/12 2228  LIPASE 2757*  AMYLASE --   No results found for this basename: AMMONIA:5 in the last 168 hours CBC:  Lab 09/01/12 2228  WBC 7.8  NEUTROABS 4.1  HGB 12.8*  HCT 38.5*  MCV 86.7  PLT 195   Cardiac Enzymes: No results found for this basename: CKTOTAL:5,CKMB:5,CKMBINDEX:5,TROPONINI:5 in the last 168 hours BNP (last 3 results) No results found for this basename: PROBNP:3 in the last 8760 hours CBG:  Lab 09/02/12 0627 09/02/12 0233  GLUCAP 352* 391*    No results found for this or any previous visit (from the past  240 hour(s)).   Studies: US Abdomen Complete  09/02/2012  *RADIOLOGY REPORT*  Clinical Data:  Abdominal pain and evaluate for pancreatitis.  COMPLETE ABDOMINAL ULTRASOUND  Comparison:  None.  Findings:  Gallbladder:  Removed.  Common bile duct:  Measures 0.5 cm.  Liver:  No focal lesion identified.  Within normal limits in parenchymal echogenicity.  IVC:  Appears normal.  Pancreas:  There is heterogeneity of the pancreatic body.  No significant pancreatic duct dilatation.  Spleen:  Measures 8.5 cm in length.  No gross abnormality.  Right Kidney:  Right kidney measures 11.2 cm in length without hydronephrosis.  Left Kidney:  Left kidney measures 10.9 cm in length.  There is a 1.1 cm hypoechoic structure within the left kidney which is poorly characterized.  Negative for hydronephrosis.  Abdominal aorta:  No aneurysm identified.  IMPRESSION: The pancreas is heterogeneous and may represent areas of edema. Findings would be consistent with pancreatitis.  If there is concern for pseudocyst formations, recommend further characterization with an abdominal CT.  Indeterminate 1.1 cm hypoechoic structure in the left kidney.  This structure is too small to definitively characterize but may represents a cyst.   Original Report Authenticated By: Richarda Overlie, M.D.     Scheduled Meds:   . amLODipine  10 mg Oral Daily  . aspirin  81 mg Oral Daily  . carvedilol  25 mg Oral BID WC  . digoxin  0.125 mg Oral QHS  . hydrALAZINE  75 mg Oral Q8H  . insulin aspart  0-15 Units Subcutaneous Q4H  . isosorbide mononitrate  15 mg Oral Daily  . simvastatin  20 mg Oral q1800  . sodium chloride  3 mL Intravenous Q12H   Continuous Infusions:   . dextrose 5 % and 0.9% NaCl 100 mL/hr at 09/02/12 1128       Time spent: 25 minutes    [name]  [facility] Pager [phone]. If 8PM-8AM, please contact night-coverage at [email or web address], password [email or web address] 09/02/2012, 11:45 AM  LOS: 1 day

## 2012-09-02 NOTE — H&P (Signed)
[facility] History and Physical  Kevis Qu ZOX:[id number] DOB: April 28, 1961 DOA: 09/01/2012  Referring physician: ED PCP: No primary provider on file.  Specialists: None  Chief Complaint: Abdominal pain  HPI: [name] is a 51 y.o. male who presents to the ED with c/o Abdominal pain.  Symptoms onset 4PM on 12/24, gradually with progressive worsening.  Quality is that it feels like the pain "when I had a gangrenous gallbladder".  Pain is improved with walking worse with lying in certain positions.  Pain is located in his epigastric area.  In the ED his work up was remarkable for a lipase of over 2700.  His creatinine was 1.40 which is a slight elevation from his baseline of about 1.2.  Hospitalist has been asked to admit.  Review of Systems: Admits to fever yesterday, 12 systems reviewed and otherwise negative.  Past Medical History  Diagnosis Date  . CHF (congestive heart failure)   . Hypertension   . Diabetes mellitus without complication   . High cholesterol   . CAD (coronary artery disease)   . Depression   . Anxiety   . Myocardial infarction   . Anginal pain   . Stroke 2012   Past Surgical History  Procedure Date  . Cholecystectomy   . Coronary artery bypass graft   . Cholecystectomy    Social History:  reports that he has been smoking Cigarettes.  He has a 10 pack-year smoking history. He has never used smokeless tobacco. He reports that he drinks alcohol. He reports that he uses illicit drugs (Marijuana). EtOH use is only occasionally and not heavy, most recently had 2 beers a couple of days ago but denies heavy or binge drinking.  No Known Allergies  History reviewed. No pertinent family history.   Prior to Admission medications   Medication Sig Start Date End Date Taking? Authorizing Provider  amLODipine (NORVASC) 10 MG tablet Take 1 tablet (10 mg total) by mouth daily. 07/19/12  Yes Rodolph Bong, MD  aspirin 81 MG tablet Take 81 mg by mouth  daily.   Yes Historical Provider, MD  carvedilol (COREG) 25 MG tablet Take 1 tablet (25 mg total) by mouth 2 (two) times daily with a meal. 07/19/12  Yes Rodolph Bong, MD  digoxin (LANOXIN) 0.125 MG tablet Take 1 tablet (0.125 mg total) by mouth at bedtime. 07/19/12  Yes Rodolph Bong, MD  furosemide (LASIX) 40 MG tablet Take 1 tablet (40 mg total) by mouth daily. 07/19/12  Yes Rodolph Bong, MD  hydrALAZINE (APRESOLINE) 25 MG tablet Take 3 tablets (75 mg total) by mouth every 8 (eight) hours. 07/19/12  Yes Rodolph Bong, MD  insulin aspart protamine-insulin aspart (NOVOLOG 70/30) (70-30) 100 UNIT/ML injection Inject 10 Units into the skin 2 (two) times daily with a meal. 07/19/12  Yes Rodolph Bong, MD  isosorbide mononitrate (IMDUR) 15 mg TB24 Take 0.5 tablets (15 mg total) by mouth daily. 07/19/12  Yes Rodolph Bong, MD  lisinopril (PRINIVIL,ZESTRIL) 20 MG tablet Take 1 tablet (20 mg total) by mouth 2 (two) times daily. 07/19/12  Yes Rodolph Bong, MD  potassium chloride SA (K-DUR,KLOR-CON) 20 MEQ tablet Take 1 tablet (20 mEq total) by mouth daily. 07/19/12  Yes Rodolph Bong, MD  pravastatin (PRAVACHOL) 40 MG tablet Take 1 tablet (40 mg total) by mouth daily. 07/19/12  Yes Rodolph Bong, MD  nitroGLYCERIN (NITROSTAT) 0.4 MG SL tablet Place 1 tablet (0.4 mg total) under the tongue every  5 (five) minutes x 3 doses as needed for chest pain. 07/19/12   Rodolph Bong, MD   Physical Exam: Daiva Eves:   09/01/12 2214 09/02/12 0004 09/02/12 0207 09/02/12 0224  BP: 168/90 142/76  162/83  Pulse: 66 62    Temp: 98.3 F (36.8 C)   97.3 F (36.3 C)  TempSrc: Oral   Oral  Resp: 18   18  Height: 6\' 2"  (1.88 m)  6\' 2"  (1.88 m)   Weight: 90.719 kg (200 lb)  88.1 kg (194 lb 3.6 oz)   SpO2: 98% 99%  99%    General:  NAD, resting comfortably in bed Eyes: PEERLA EOMI ENT: mucous membranes moist Neck: supple w/o JVD Cardiovascular: RRR w/o MRG Respiratory: CTA  B Abdomen: soft, mild to moderate epigastric tenderness, no rebound, no guarding, nd, bs+ Skin: no rash nor lesion Musculoskeletal: MAE, full ROM all 4 extremities Psychiatric: normal tone and affect Neurologic: AAOx3, grossly non-focal  Labs on Admission:  Basic Metabolic Panel:  Lab 09/01/12 7829  NA 137  K 4.2  CL 99  CO2 30  GLUCOSE 358*  BUN 18  CREATININE 1.40*  CALCIUM 9.6  MG --  PHOS --   Liver Function Tests:  Lab 09/01/12 2228  AST 14  ALT 12  ALKPHOS 60  BILITOT 0.2*  PROT 7.7  ALBUMIN 3.5    Lab 09/01/12 2228  LIPASE 2757*  AMYLASE --   No results found for this basename: AMMONIA:5 in the last 168 hours CBC:  Lab 09/01/12 2228  WBC 7.8  NEUTROABS 4.1  HGB 12.8*  HCT 38.5*  MCV 86.7  PLT 195   Cardiac Enzymes: No results found for this basename: CKTOTAL:5,CKMB:5,CKMBINDEX:5,TROPONINI:5 in the last 168 hours  BNP (last 3 results) No results found for this basename: PROBNP:3 in the last 8760 hours CBG: No results found for this basename: GLUCAP:5 in the last 168 hours  Radiological Exams on Admission: No results found.  EKG: Independently reviewed.  Assessment/Plan Principal Problem:  *Acute pancreatitis Active Problems:  Hypertension  Diabetes mellitus without complication  CHF (congestive heart failure)  Acute renal insufficiency   1. Acute pancreatitis -  denies heavy drinking or binge drinking of EtOH last drink was 2 beers several days ago, dosent know about a history of elevated triglycerides but will check lipid pannel, gallbladder was removed in the past but will check [place] to see if there is any evidence of retained stone, patient is not on any medications that are obvious causes of pancreatitis, he has no history of scorpion stings.  Fluids at 125 cc/hr, NPO except ice chips and meds. 2. DM2 - BGLs elevated in ED this evening, will put him on Q4H med dose SSI while NPO, if this fails then will need insulin gtt. 3. Acute renal  insufficiency - very mild bump in his creatinine but in light of pancreatitis is enough to be concerned about, will hold lasix and ACEi, he is receiving fluids for #1 already.  Strict Is and Os.    Code Status: Full Code (must indicate code status--if unknown or must be presumed, indicate so) Family Communication: No family in room, spoke with patient directly and answered his questions (indicate person spoken with, if applicable, with phone number if by telephone) Disposition Plan: Admit to inpatient (indicate anticipated LOS)  Time spent: 70 min  GARDNER, JARED M. [facility] Pager [phone]  If 7PM-7AM, please contact night-coverage [email or web address] Password [facility] 09/02/2012, 4:13 AM

## 2012-09-02 NOTE — Progress Notes (Signed)
Pt blood sugar=48.  Pt lethargic and diaphoretic with MD Dhungel at bedside. 50 mls of D50 given with blood sugar recheck = 148. D5 NaCl started at 100 ml/hr per MD order.  Will con't to monitor pt and will with notify MD of any changes.

## 2012-09-02 NOTE — ED Notes (Signed)
Assigned to bed 4740 @ [facility], RN notified, [facility] called for transport.

## 2012-09-03 DIAGNOSIS — I10 Essential (primary) hypertension: Secondary | ICD-10-CM

## 2012-09-03 LAB — LIPASE, BLOOD: Lipase: 130 U/L — ABNORMAL HIGH (ref 11–59)

## 2012-09-03 LAB — GLUCOSE, CAPILLARY
Glucose-Capillary: 138 mg/dL — ABNORMAL HIGH (ref 70–99)
Glucose-Capillary: 173 mg/dL — ABNORMAL HIGH (ref 70–99)

## 2012-09-03 LAB — BASIC METABOLIC PANEL
Chloride: 106 mEq/L (ref 96–112)
Creatinine, Ser: 1.15 mg/dL (ref 0.50–1.35)
GFR calc Af Amer: 82 mL/min — ABNORMAL LOW (ref >90)
Potassium: 3.4 mEq/L — ABNORMAL LOW (ref 3.5–5.1)
Sodium: 140 mEq/L (ref 135–145)

## 2012-09-03 MED ORDER — POTASSIUM CHLORIDE CRYS ER 20 MEQ PO TBCR
40.0000 meq | EXTENDED_RELEASE_TABLET | Freq: Once | ORAL | Status: AC
  Administered 2012-09-03: 40 meq via ORAL
  Filled 2012-09-03: qty 2

## 2012-09-03 MED ORDER — INSULIN ASPART 100 UNIT/ML ~~LOC~~ SOLN
0.0000 [IU] | Freq: Three times a day (TID) | SUBCUTANEOUS | Status: DC
  Administered 2012-09-03: 2 [IU] via SUBCUTANEOUS
  Administered 2012-09-03: 3 [IU] via SUBCUTANEOUS
  Administered 2012-09-04: 2 [IU] via SUBCUTANEOUS
  Administered 2012-09-04: 3 [IU] via SUBCUTANEOUS
  Administered 2012-09-04: 2 [IU] via SUBCUTANEOUS
  Administered 2012-09-05: 1 [IU] via SUBCUTANEOUS
  Administered 2012-09-05: 3 [IU] via SUBCUTANEOUS
  Administered 2012-09-05: 2 [IU] via SUBCUTANEOUS
  Administered 2012-09-06: 3 [IU] via SUBCUTANEOUS
  Administered 2012-09-06: 5 [IU] via SUBCUTANEOUS
  Administered 2012-09-07: 2 [IU] via SUBCUTANEOUS
  Administered 2012-09-07: 3 [IU] via SUBCUTANEOUS

## 2012-09-03 MED ORDER — INSULIN GLARGINE 100 UNIT/ML ~~LOC~~ SOLN
5.0000 [IU] | Freq: Every day | SUBCUTANEOUS | Status: DC
  Administered 2012-09-03: 5 [IU] via SUBCUTANEOUS

## 2012-09-03 NOTE — Progress Notes (Addendum)
[facility] PROGRESS NOTE  [name] WUJ:[id number] DOB: [birth year] DOA: 09/01/2012 PCP: No primary provider on file.  Assessment/Plan:  Acute pancreatitis.  No clear etiology. Patient informs of drinking occasionally. No medications that could contribute to  acute pancreatitis. Patient has had history of cholecystectomy. Right upper quadrant ultrasound does not show any CBD stones. However does have some pancreatic edema suggestive of early pancreatitis with edema.. CT abdomen done shows  mild peripancreatic inflammatory changes involving the  pancreatic head/uncinate process.  -will start on clears, pain much improved. o nausea or vomiting. Lipase of 2700 on admission,  now down to 140.    Diabetes mellitus with hypoglycemia.  -hypoglycemia on 12/26 with fsg down to 48 requiring D50 and D5NS. His A1C is quite high . Will change to sensitive  sliding scale with meals . add lantus 5 units at bedtime. patient on NPH 10 units BID at home.   CHF  -History of CAD with CABG  Currently euvolemic. Continue digoxin, Lisinopril, Coreg aspirin and Lasix. Continue Imdur  Recent echo 6 weeks back showing EF of 60-65% with grade 2 diastolic dysfunction. For CHF symptoms while on IV fluids.  -Patient follows with [facility]. He was admitted to the hospital 6 weeks back and had a cardiac cath for chest pain.  -Continue Zocor   CK D.  Appears at baseline. Continue to monitor    Hypokalemia  mild. Replenish with kcl  DVT prophylaxis   Code Status: Full  Family Communication: None at bedside  Disposition Plan: Home once stable. Likely in next 1-2 days if continues to improve.   Consultants:  none  Procedures:  none  Antibiotics:  none  HPI/Subjective: feels better. epigastric pain improved. No further hypoglycemic episodes  Objective: Filed Vitals:   09/03/12 0411 09/03/12 0640 09/03/12 0641 09/03/12 0750  BP: 144/72 163/82 158/80 148/74  Pulse: 63    69  Temp: 99.2 F (37.3 C)   99 F (37.2 C)  TempSrc: Oral   Oral  Resp: 19   20  Height:      Weight: 89.676 kg (197 lb 11.2 oz)     SpO2: 96%   98%    Intake/Output Summary (Last 24 hours) at 09/03/12 1044 Last data filed at 09/03/12 1014  Gross per 24 hour  Intake 1711.67 ml  Output    150 ml  Net 1561.67 ml   Filed Weights   09/01/12 2214 09/02/12 0207 09/03/12 0411  Weight: 90.719 kg (200 lb) 88.1 kg (194 lb 3.6 oz) 89.676 kg (197 lb 11.2 oz)    Exam:   General:  Middle aged male in NAD  HEENT: no pallor, moist oral mucosa  Cardiovascular: N S1&S2, no murmurs  Respiratory: clear b/l, no added sounds  Abdomen: soft, Mild epigastric tenderness,  ND, BS+  Ext: warm, no edema  CNS: AAOX 3   Data Reviewed: Basic Metabolic Panel:  Lab 09/03/12 9562 09/02/12 1606 09/02/12 1355 09/01/12 2228  NA 140 138 142 137  K 3.4* 3.9 4.2 4.2  CL 106 103 106 99  CO2 26 23 29 30   GLUCOSE 190* 197* 121* 358*  BUN 15 19 19 18   CREATININE 1.15 1.28 1.41* 1.40*  CALCIUM 8.3* 8.5 9.2 9.6  MG -- -- -- --  PHOS -- -- -- --   Liver Function Tests:  Lab 09/01/12 2228  AST 14  ALT 12  ALKPHOS 60  BILITOT 0.2*  PROT 7.7  ALBUMIN 3.5  Lab 09/03/12 0535 09/02/12 1355 09/01/12 2228  LIPASE 130* 524* 2757*  AMYLASE -- -- --   No results found for this basename: AMMONIA:5 in the last 168 hours CBC:  Lab 09/02/12 1606 09/01/12 2228  WBC 7.7 7.8  NEUTROABS -- 4.1  HGB 10.7* 12.8*  HCT 32.6* 38.5*  MCV 85.8 86.7  PLT 166 195   Cardiac Enzymes: No results found for this basename: CKTOTAL:5,CKMB:5,CKMBINDEX:5,TROPONINI:5 in the last 168 hours BNP (last 3 results) No results found for this basename: PROBNP:3 in the last 8760 hours CBG:  Lab 09/03/12 0759 09/03/12 0406 09/03/12 0001 09/02/12 1953 09/02/12 1556  GLUCAP 173* 168* 162* 138* 204*    No results found for this or any previous visit (from the past 240 hour(s)).   Studies: Ct Abdomen Wo  Contrast  09/02/2012  **ADDENDUM** CREATED: 09/02/2012 17:43:23  Addendum to the findings section of the original report:  Additional mild peripancreatic inflammatory changes along the pancreatic tail (series 2/images 22-24).  The remainder of the report is unchanged.  **END ADDENDUM** SIGNED BY: Charline Bills, M.D.   09/02/2012  *RADIOLOGY REPORT*  Clinical Data:  Reevaluate acute pancreatitis  CT ABDOMEN WITHOUT CONTRAST  Technique:  Multidetector CT imaging of the abdomen was performed following the standard protocol without IV contrast.  Comparison:  None.  Findings:  Mild linear scarring versus atelectasis at the left lung base.  Unenhanced liver, spleen, and adrenal glands are unremarkable.  Appearance of the pancreatic head/uncinate process is very mildly heterogeneous, with possible mild peripancreatic inflammatory changes.  No drainable fluid collection/pseudocyst.  Status post cholecystectomy.  No intrahepatic or extrahepatic ductal dilatation.  Kidneys are unremarkable.  No renal calculi or hydronephrosis.  Atherosclerotic calcifications of the abdominal aorta and branch vessels.  No abdominal ascites.  Small retroperitoneal lymph nodes which do not meet pathologic CT size criteria.  Mild degenerative changes of the visualized thoracolumbar spine.  IMPRESSION: Possible mild peripancreatic inflammatory changes involving the pancreatic head/uncinate process.  No drainable fluid collection/pseudocyst.   Original Report Authenticated By: Charline Bills, M.D.    US Abdomen Complete  09/02/2012  *RADIOLOGY REPORT*  Clinical Data:  Abdominal pain and evaluate for pancreatitis.  COMPLETE ABDOMINAL ULTRASOUND  Comparison:  None.  Findings:  Gallbladder:  Removed.  Common bile duct:  Measures 0.5 cm.  Liver:  No focal lesion identified.  Within normal limits in parenchymal echogenicity.  IVC:  Appears normal.  Pancreas:  There is heterogeneity of the pancreatic body.  No significant pancreatic duct  dilatation.  Spleen:  Measures 8.5 cm in length.  No gross abnormality.  Right Kidney:  Right kidney measures 11.2 cm in length without hydronephrosis.  Left Kidney:  Left kidney measures 10.9 cm in length.  There is a 1.1 cm hypoechoic structure within the left kidney which is poorly characterized.  Negative for hydronephrosis.  Abdominal aorta:  No aneurysm identified.  IMPRESSION: The pancreas is heterogeneous and may represent areas of edema. Findings would be consistent with pancreatitis.  If there is concern for pseudocyst formations, recommend further characterization with an abdominal CT.  Indeterminate 1.1 cm hypoechoic structure in the left kidney.  This structure is too small to definitively characterize but may represents a cyst.   Original Report Authenticated By: Richarda Overlie, M.D.     Scheduled Meds:   . amLODipine  10 mg Oral Daily  . aspirin  81 mg Oral Daily  . carvedilol  25 mg Oral BID WC  . digoxin  0.125 mg Oral  QHS  . hydrALAZINE  75 mg Oral Q8H  . insulin aspart  0-15 Units Subcutaneous Q4H  . isosorbide mononitrate  15 mg Oral Daily  . simvastatin  20 mg Oral q1800  . sodium chloride  3 mL Intravenous Q12H   Continuous Infusions:     Time spent: 25 MINUTES    Tamiya Colello  [facility] Pager [phone]. If 8PM-8AM, please contact night-coverage at [email or web address], password [email or web address] 09/03/2012, 10:44 AM  LOS: 2 days

## 2012-09-03 NOTE — Progress Notes (Signed)
Utilization Review Completed.   Kenedy Haisley, RN, BSN Nurse Case Manager  [phone]  

## 2012-09-04 LAB — LIPASE, BLOOD: Lipase: 97 U/L — ABNORMAL HIGH (ref 11–59)

## 2012-09-04 LAB — GLUCOSE, CAPILLARY

## 2012-09-04 MED ORDER — POTASSIUM CHLORIDE CRYS ER 20 MEQ PO TBCR
40.0000 meq | EXTENDED_RELEASE_TABLET | Freq: Once | ORAL | Status: AC
  Administered 2012-09-04: 40 meq via ORAL
  Filled 2012-09-04: qty 2

## 2012-09-04 MED ORDER — INSULIN GLARGINE 100 UNIT/ML ~~LOC~~ SOLN
8.0000 [IU] | Freq: Every day | SUBCUTANEOUS | Status: DC
  Administered 2012-09-04 – 2012-09-06 (×3): 8 [IU] via SUBCUTANEOUS

## 2012-09-04 NOTE — Progress Notes (Signed)
[facility] PROGRESS NOTE  [name] WRU:[id number] DOB: 1961-02-10 DOA: 09/01/2012 PCP: No primary provider on file.  Assessment/Plan:  Acute pancreatitis.  No clear etiology. Patient informs of drinking occasionally. No medications that could contribute to  acute pancreatitis. Patient has had history of cholecystectomy. Right upper quadrant ultrasound does not show any CBD stones. However does have some pancreatic edema suggestive of early pancreatitis with edema.. CT abdomen done shows  mild peripancreatic inflammatory changes involving the  pancreatic head/uncinate process.  -will advance to carb mod diet, pain much improved. no nausea or vomiting. Lipase of 2700 on admission,  now down to 97 today 12/28.    Diabetes mellitus with hypoglycemia.  -hypoglycemia on 12/26 with fsg down to 48 requiring D50 and D5NS. His A1C is quite high . Will change to sensitive  sliding scale with meals . add lantus 5 units at bedtime. patient on NPH 10 units BID at home.  - no further hypoglycemic episodes last pm 12/27, poor BG control- will increase lantus and follow  CHF  -History of CAD with CABG  Currently euvolemic. Continue digoxin, Lisinopril, Coreg aspirin and Lasix. Continue Imdur  Recent echo 6 weeks back showing EF of 60-65% with grade 2 diastolic dysfunction. For CHF symptoms while on IV fluids.  -[facility]. He was admitted to the hospital 6 weeks back and had a cardiac cath for chest pain.  -Continue Zocor   CK D.  Appears at baseline. Continue to monitor    Hypokalemia Recheck in am  DVT prophylaxis   Code Status: Full  Family Communication: None at bedside  Disposition Plan: Home once stable. Likely in next 1-2 days if continues to improve.   Consultants:  none  Procedures:  none  Antibiotics:  none  HPI/Subjective: feels better. States abd pain decreasing, no N/V  Objective: Filed Vitals:   09/04/12 0426  09/04/12 0608 09/04/12 1022 09/04/12 1346  BP: 169/75 159/83 172/74 149/73  Pulse: 54   56  Temp: 98.5 F (36.9 C)   98.5 F (36.9 C)  TempSrc: Oral   Oral  Resp: 18   20  Height:      Weight: 87.4 kg (192 lb 10.9 oz)     SpO2: 100%   98%    Intake/Output Summary (Last 24 hours) at 09/04/12 1415 Last data filed at 09/04/12 1319  Gross per 24 hour  Intake   1648 ml  Output   1425 ml  Net    223 ml   Filed Weights   09/02/12 0207 09/03/12 0411 09/04/12 0426  Weight: 88.1 kg (194 lb 3.6 oz) 89.676 kg (197 lb 11.2 oz) 87.4 kg (192 lb 10.9 oz)    Exam:   General:  Middle aged male in NAD  HEENT: no pallor, moist oral mucosa  Cardiovascular: N S1&S2, no murmurs  Respiratory: clear b/l, no added sounds  Abdomen: soft, Mild LUQ tenderness,  ND, BS+  Ext: warm, no edema  CNS: AAOX 3   Data Reviewed: Basic Metabolic Panel:  Lab 09/03/12 9147 09/02/12 1606 09/02/12 1355 09/01/12 2228  NA 140 138 142 137  K 3.4* 3.9 4.2 4.2  CL 106 103 106 99  CO2 26 23 29 30   GLUCOSE 190* 197* 121* 358*  BUN 15 19 19 18   CREATININE 1.15 1.28 1.41* 1.40*  CALCIUM 8.3* 8.5 9.2 9.6  MG -- -- -- --  PHOS -- -- -- --   Liver Function Tests:  Lab  09/01/12 2228  AST 14  ALT 12  ALKPHOS 60  BILITOT 0.2*  PROT 7.7  ALBUMIN 3.5    Lab 09/04/12 0447 09/03/12 0535 10-01-12 1355 09/01/12 2228  LIPASE 97* 130* 524* 2757*  AMYLASE -- -- -- --   No results found for this basename: AMMONIA:5 in the last 168 hours CBC:  Lab 2012-10-01 1606 09/01/12 2228  WBC 7.7 7.8  NEUTROABS -- 4.1  HGB 10.7* 12.8*  HCT 32.6* 38.5*  MCV 85.8 86.7  PLT 166 195   Cardiac Enzymes: No results found for this basename: CKTOTAL:5,CKMB:5,CKMBINDEX:5,TROPONINI:5 in the last 168 hours BNP (last 3 results) No results found for this basename: PROBNP:3 in the last 8760 hours CBG:  Lab 09/04/12 1127 09/04/12 0424 09/03/12 2354 09/03/12 1936 09/03/12 1702  GLUCAP 235* 164* 162* 170* 174*    No results  found for this or any previous visit (from the past 240 hour(s)).   Studies: Ct Abdomen Wo Contrast  October 01, 2012  **ADDENDUM** CREATED: Oct 01, 2012 17:43:23  Addendum to the findings section of the original report:  Additional mild peripancreatic inflammatory changes along the pancreatic tail (series 2/images 22-24).  The remainder of the report is unchanged.  **END ADDENDUM** SIGNED BY: Charline Bills, M.D.   01-Oct-2012  *RADIOLOGY REPORT*  Clinical Data:  Reevaluate acute pancreatitis  CT ABDOMEN WITHOUT CONTRAST  Technique:  Multidetector CT imaging of the abdomen was performed following the standard protocol without IV contrast.  Comparison:  None.  Findings:  Mild linear scarring versus atelectasis at the left lung base.  Unenhanced liver, spleen, and adrenal glands are unremarkable.  Appearance of the pancreatic head/uncinate process is very mildly heterogeneous, with possible mild peripancreatic inflammatory changes.  No drainable fluid collection/pseudocyst.  Status post cholecystectomy.  No intrahepatic or extrahepatic ductal dilatation.  Kidneys are unremarkable.  No renal calculi or hydronephrosis.  Atherosclerotic calcifications of the abdominal aorta and branch vessels.  No abdominal ascites.  Small retroperitoneal lymph nodes which do not meet pathologic CT size criteria.  Mild degenerative changes of the visualized thoracolumbar spine.  IMPRESSION: Possible mild peripancreatic inflammatory changes involving the pancreatic head/uncinate process.  No drainable fluid collection/pseudocyst.   Original Report Authenticated By: Charline Bills, M.D.     Scheduled Meds:    . amLODipine  10 mg Oral Daily  . aspirin  81 mg Oral Daily  . carvedilol  25 mg Oral BID WC  . digoxin  0.125 mg Oral QHS  . hydrALAZINE  75 mg Oral Q8H  . insulin aspart  0-9 Units Subcutaneous TID WC  . insulin glargine  5 Units Subcutaneous QHS  . isosorbide mononitrate  15 mg Oral Daily  . potassium chloride  40  mEq Oral Once  . simvastatin  20 mg Oral q1800  . sodium chloride  3 mL Intravenous Q12H   Continuous Infusions:     Time spent: 25 MINUTES    Lyna Laningham C  [facility] Pager [phone]. If 8PM-8AM, please contact night-coverage at [email or web address], password [facility] 09/04/2012, 2:15 PM  LOS: 3 days

## 2012-09-05 LAB — BASIC METABOLIC PANEL
CO2: 27 mEq/L (ref 19–32)
Calcium: 9.3 mg/dL (ref 8.4–10.5)
Chloride: 106 mEq/L (ref 96–112)
Glucose, Bld: 120 mg/dL — ABNORMAL HIGH (ref 70–99)
Sodium: 142 mEq/L (ref 135–145)

## 2012-09-05 LAB — GLUCOSE, CAPILLARY
Glucose-Capillary: 125 mg/dL — ABNORMAL HIGH (ref 70–99)
Glucose-Capillary: 185 mg/dL — ABNORMAL HIGH (ref 70–99)
Glucose-Capillary: 304 mg/dL — ABNORMAL HIGH (ref 70–99)

## 2012-09-05 NOTE — Progress Notes (Addendum)
[facility] PROGRESS NOTE  Anoop Hemmer ZOX:[id number] DOB: September 12, 1960 DOA: 09/01/2012 PCP: No primary provider on file.  Assessment/Plan:  Acute pancreatitis.  No clear etiology. Patient informs of drinking occasionally. No medications that could contribute to  acute pancreatitis. Patient has had history of cholecystectomy. Right upper quadrant ultrasound does not show any CBD stones. However does have some pancreatic edema suggestive of early pancreatitis with edema.. CT abdomen done shows  mild peripancreatic inflammatory changes involving the  pancreatic head/uncinate process.  -advanced to carb mod diet on 12/28, pain much improved. no nausea or vomiting. Lipase of 2700 on admission,  now down to 97 today 12/28.  - tolerating solids but lipase back up to 133 today, will monitor today and recheck lipase in am  Diabetes mellitus with hypoglycemia.  -hypoglycemia on 12/26 with fsg down to 48 requiring D50 and D5NS. His A1C is quite high . Will change to sensitive  sliding scale with meals . add lantus 5 units at bedtime. patient on NPH 10 units BID at home.  - still no further hypoglycemic episodes and better BG control- continue current lantus and follow  CHF  -History of CAD with CABG  Currently euvolemic. Continue digoxin, Lisinopril, Coreg aspirin and Lasix. Continue Imdur  Recent echo 6 weeks back showing EF of 60-65% with grade 2 diastolic dysfunction. For CHF symptoms while on IV fluids.  -Patient follows with [facility]. He was admitted to the hospital 6 weeks back and had a cardiac cath for chest pain.  -Continue Zocor   CK D.  Appears at baseline. Continue to monitor    Hypokalemia Recheck in am  DVT prophylaxis   Code Status: Full  Family Communication: None at bedside  Disposition Plan: Home once stable.    Consultants:  none  Procedures:  none  Antibiotics:  none  HPI/Subjective: States still some 'soreness', but not  worsened by eating. Denies nausea or vomiting.  Objective: Filed Vitals:   09/05/12 0700 09/05/12 1037 09/05/12 1321 09/05/12 1402  BP: 140/62 172/74 142/73 152/64  Pulse: 55  56 55  Temp:    98.4 F (36.9 C)  TempSrc:    Oral  Resp:    18  Height:      Weight:      SpO2:    97%    Intake/Output Summary (Last 24 hours) at 09/05/12 1424 Last data filed at 09/05/12 1337  Gross per 24 hour  Intake   1623 ml  Output   1525 ml  Net     98 ml   Filed Weights   09/03/12 0411 09/04/12 0426 09/05/12 0300  Weight: 89.676 kg (197 lb 11.2 oz) 87.4 kg (192 lb 10.9 oz) 88.3 kg (194 lb 10.7 oz)    Exam:   General:  Middle aged male in NAD  HEENT: no pallor, moist oral mucosa  Cardiovascular: N S1&S2, no murmurs  Respiratory: clear b/l, no added sounds  Abdomen: soft, Mild LUQ tenderness,  ND, BS+  Ext: warm, no edema  CNS: AAOX 3   Data Reviewed: Basic Metabolic Panel:  Lab 09/05/12 8119 09/03/12 0535 09/02/12 1606 09/02/12 1355 09/01/12 2228  NA 142 140 138 142 137  K 3.6 3.4* 3.9 4.2 4.2  CL 106 106 103 106 99  CO2 27 26 23 29 30   GLUCOSE 120* 190* 197* 121* 358*  BUN 14 15 19 19 18   CREATININE 1.27 1.15 1.28 1.41* 1.40*  CALCIUM 9.3 8.3* 8.5 9.2 9.6  MG -- -- -- -- --  PHOS -- -- -- -- --   Liver Function Tests:  Lab 09/01/12 2228  AST 14  ALT 12  ALKPHOS 60  BILITOT 0.2*  PROT 7.7  ALBUMIN 3.5    Lab 09/05/12 0440 09/04/12 0447 09/03/12 0535 09/02/12 1355 09/01/12 2228  LIPASE 133* 97* 130* 524* 2757*  AMYLASE -- -- -- -- --   No results found for this basename: AMMONIA:5 in the last 168 hours CBC:  Lab 09/02/12 1606 09/01/12 2228  WBC 7.7 7.8  NEUTROABS -- 4.1  HGB 10.7* 12.8*  HCT 32.6* 38.5*  MCV 85.8 86.7  PLT 166 195   Cardiac Enzymes: No results found for this basename: CKTOTAL:5,CKMB:5,CKMBINDEX:5,TROPONINI:5 in the last 168 hours BNP (last 3 results) No results found for this basename: PROBNP:3 in the last 8760 hours CBG:  Lab  09/05/12 1129 09/05/12 0836 09/05/12 0349 09/04/12 2347 09/04/12 2000  GLUCAP 185* 159* 125* 230* 200*    No results found for this or any previous visit (from the past 240 hour(s)).   Studies: No results found.  Scheduled Meds:    . amLODipine  10 mg Oral Daily  . aspirin  81 mg Oral Daily  . carvedilol  25 mg Oral BID WC  . digoxin  0.125 mg Oral QHS  . hydrALAZINE  75 mg Oral Q8H  . insulin aspart  0-9 Units Subcutaneous TID WC  . insulin glargine  8 Units Subcutaneous QHS  . isosorbide mononitrate  15 mg Oral Daily  . simvastatin  20 mg Oral q1800  . sodium chloride  3 mL Intravenous Q12H   Continuous Infusions:     Time spent: 25 MINUTES    Tanmay Halteman C  [facility] Pager [phone]. If 8PM-8AM, please contact night-coverage at [email or web address], password [facility] 09/05/2012, 2:24 PM  LOS: 4 days

## 2012-09-06 DIAGNOSIS — I2510 Atherosclerotic heart disease of native coronary artery without angina pectoris: Secondary | ICD-10-CM

## 2012-09-06 LAB — GLUCOSE, CAPILLARY
Glucose-Capillary: 229 mg/dL — ABNORMAL HIGH (ref 70–99)
Glucose-Capillary: 261 mg/dL — ABNORMAL HIGH (ref 70–99)

## 2012-09-06 NOTE — Progress Notes (Signed)
[facility] PROGRESS NOTE  [name] RUE:[id number] DOB: 03-12-1961 DOA: 09/01/2012 PCP: No primary provider on file.  Assessment/Plan:  Acute pancreatitis.  No clear etiology. Patient informs of drinking occasionally. No medications that could contribute to  acute pancreatitis. Patient has had history of cholecystectomy. Right upper quadrant ultrasound does not show any CBD stones. However does have some pancreatic edema suggestive of early pancreatitis with edema.. CT abdomen done shows  mild peripancreatic inflammatory changes involving the  pancreatic head/uncinate process.  -advanced to carb mod diet on 12/28, pain much improved. no nausea or vomiting. Lipase of 2700 on admission,  Was down to 97 today 12/28.  - tolerating solids but lipase back up to 133 today,  lipase still trending up-148 today 12/30, discussed with a GI and recommend him watching inpatient and to consider repeating a CT abdomen only if abdominal pain worse or fevers  Diabetes mellitus with hypoglycemia.  -hypoglycemia on 12/26 with fsg down to 48 requiring D50 and D5NS. His A1C is quite high . Will change to sensitive  sliding scale with meals . add lantus 5 units at bedtime. patient on NPH 10 units BID at home.  - still no further hypoglycemic episodes and better BG control- continue current lantus and follow  CHF  -History of CAD with CABG  Currently euvolemic. Continue digoxin, Lisinopril, Coreg aspirin and Lasix. Continue Imdur  Recent echo 6 weeks back showing EF of 60-65% with grade 2 diastolic dysfunction. For CHF symptoms while on IV fluids.  -Patient follows with [facility]. He was admitted to the hospital 6 weeks back and had a cardiac cath for chest pain.  -Continue Zocor   CK D.  Appears at baseline. Continue to monitor    Hypokalemia Recheck in am 12/31  DVT prophylaxis   Code Status: Full  Family Communication: None at bedside  Disposition Plan: Home once  stable.    Consultants:  none  Procedures:  none  Antibiotics:  none  HPI/Subjective: States still some abd pain but not any worse, and not aggravated by eating. Denies nausea or vomiting.  Objective: Filed Vitals:   09/06/12 0405 09/06/12 1114 09/06/12 1310 09/06/12 1352  BP: 152/64 164/77 116/58 136/66  Pulse: 59 55 60 55  Temp: 98.1 F (36.7 C) 98.4 F (36.9 C) 98.4 F (36.9 C) 98.2 F (36.8 C)  TempSrc: Oral Oral Oral Oral  Resp: 18  18 19   Height:      Weight: 88.4 kg (194 lb 14.2 oz)     SpO2: 98%   99%    Intake/Output Summary (Last 24 hours) at 09/06/12 1726 Last data filed at 09/06/12 1430  Gross per 24 hour  Intake   1362 ml  Output   2150 ml  Net   -788 ml   Filed Weights   09/04/12 0426 09/05/12 0300 09/06/12 0405  Weight: 87.4 kg (192 lb 10.9 oz) 88.3 kg (194 lb 10.7 oz) 88.4 kg (194 lb 14.2 oz)    Exam:   General:  Middle aged male in NAD  HEENT: no pallor, moist oral mucosa  Cardiovascular: N S1&S2, no murmurs  Respiratory: clear b/l, no added sounds  Abdomen: soft, Mild LUQ tenderness,  ND, BS+  Ext: warm, no edema  CNS: AAOX 3   Data Reviewed: Basic Metabolic Panel:  Lab 09/05/12 1478 09/03/12 0535 09/02/12 1606 09/02/12 1355 09/01/12 2228  NA 142 140 138 142 137  K 3.6 3.4* 3.9 4.2 4.2  CL 106 106  103 106 99  CO2 27 26 23 29 30   GLUCOSE 120* 190* 197* 121* 358*  BUN 14 15 19 19 18   CREATININE 1.27 1.15 1.28 1.41* 1.40*  CALCIUM 9.3 8.3* 8.5 9.2 9.6  MG -- -- -- -- --  PHOS -- -- -- -- --   Liver Function Tests:  Lab 09/01/12 2228  AST 14  ALT 12  ALKPHOS 60  BILITOT 0.2*  PROT 7.7  ALBUMIN 3.5    Lab 09/06/12 0917 09/05/12 0440 09/04/12 0447 09/03/12 0535 09/02/12 1355  LIPASE 148* 133* 97* 130* 524*  AMYLASE -- -- -- -- --   No results found for this basename: AMMONIA:5 in the last 168 hours CBC:  Lab 09/02/12 1606 09/01/12 2228  WBC 7.7 7.8  NEUTROABS -- 4.1  HGB 10.7* 12.8*  HCT 32.6* 38.5*  MCV  85.8 86.7  PLT 166 195   Cardiac Enzymes: No results found for this basename: CKTOTAL:5,CKMB:5,CKMBINDEX:5,TROPONINI:5 in the last 168 hours BNP (last 3 results) No results found for this basename: PROBNP:3 in the last 8760 hours CBG:  Lab 09/06/12 1144 09/06/12 0546 09/05/12 1929 09/05/12 1712 09/05/12 1129  GLUCAP 229* 102* 304* 224* 185*    No results found for this or any previous visit (from the past 240 hour(s)).   Studies: No results found.  Scheduled Meds:    . amLODipine  10 mg Oral Daily  . aspirin  81 mg Oral Daily  . carvedilol  25 mg Oral BID WC  . digoxin  0.125 mg Oral QHS  . hydrALAZINE  75 mg Oral Q8H  . insulin aspart  0-9 Units Subcutaneous TID WC  . insulin glargine  8 Units Subcutaneous QHS  . isosorbide mononitrate  15 mg Oral Daily  . simvastatin  20 mg Oral q1800  . sodium chloride  3 mL Intravenous Q12H   Continuous Infusions:     Time spent: 25 MINUTES    Glendoris Nodarse C  [facility] Pager [phone]. If 8PM-8AM, please contact night-coverage at [email or web address], password [facility] 09/06/2012, 5:26 PM  LOS: 5 days

## 2012-09-07 DIAGNOSIS — I509 Heart failure, unspecified: Secondary | ICD-10-CM

## 2012-09-07 LAB — GLUCOSE, CAPILLARY: Glucose-Capillary: 176 mg/dL — ABNORMAL HIGH (ref 70–99)

## 2012-09-07 LAB — COMPREHENSIVE METABOLIC PANEL
ALT: 11 U/L (ref 0–53)
AST: 18 U/L (ref 0–37)
Albumin: 2.9 g/dL — ABNORMAL LOW (ref 3.5–5.2)
CO2: 27 mEq/L (ref 19–32)
Calcium: 9 mg/dL (ref 8.4–10.5)
Creatinine, Ser: 1.28 mg/dL (ref 0.50–1.35)
Sodium: 137 mEq/L (ref 135–145)
Total Protein: 6.4 g/dL (ref 6.0–8.3)

## 2012-09-07 MED ORDER — DIGOXIN 125 MCG PO TABS: 0.1250 mg | ORAL_TABLET | Freq: Every day | ORAL | Status: AC

## 2012-09-07 MED ORDER — ISOSORBIDE MONONITRATE 15 MG HALF TABLET: 15.0000 mg | ORAL_TABLET | Freq: Every day | ORAL | Status: AC

## 2012-09-07 MED ORDER — HYDRALAZINE HCL 25 MG PO TABS: 75.0000 mg | ORAL_TABLET | Freq: Three times a day (TID) | ORAL | Status: DC

## 2012-09-07 MED ORDER — POTASSIUM CHLORIDE CRYS ER 20 MEQ PO TBCR: 20.0000 meq | EXTENDED_RELEASE_TABLET | Freq: Every day | ORAL | Status: AC

## 2012-09-07 MED ORDER — HYDROCODONE-ACETAMINOPHEN 5-300 MG PO TABS: 5.0000 mg | ORAL_TABLET | Freq: Four times a day (QID) | ORAL | Status: AC | PRN

## 2012-09-07 MED ORDER — HYDRALAZINE HCL 25 MG PO TABS: 75.0000 mg | ORAL_TABLET | Freq: Three times a day (TID) | ORAL | Status: AC

## 2012-09-07 MED ORDER — AMLODIPINE BESYLATE 10 MG PO TABS: 10.0000 mg | ORAL_TABLET | Freq: Every day | ORAL | Status: AC

## 2012-09-07 MED ORDER — PRAVASTATIN SODIUM 40 MG PO TABS: 40.0000 mg | ORAL_TABLET | Freq: Every day | ORAL | Status: AC

## 2012-09-07 MED ORDER — CARVEDILOL 25 MG PO TABS: 25.0000 mg | ORAL_TABLET | Freq: Two times a day (BID) | ORAL | Status: AC

## 2012-09-07 MED ORDER — INSULIN ASPART PROT & ASPART (70-30 MIX) 100 UNIT/ML ~~LOC~~ SUSP: 10.0000 [IU] | Freq: Two times a day (BID) | SUBCUTANEOUS | Status: AC

## 2012-09-07 MED ORDER — NITROGLYCERIN 0.4 MG SL SUBL: 0.4000 mg | SUBLINGUAL_TABLET | SUBLINGUAL | Status: AC | PRN

## 2012-09-07 MED ORDER — FUROSEMIDE 40 MG PO TABS: 40.0000 mg | ORAL_TABLET | Freq: Every day | ORAL | Status: AC

## 2012-09-07 NOTE — Discharge Summary (Signed)
Physician Discharge Summary  [name] ZOX:[id number] DOB: 1960-12-29 DOA: 09/01/2012  PCP: No primary provider on file.  Admit date: 09/01/2012 Discharge date: 09/07/2012  Time spent: >30 minutes  Recommendations for Outpatient Follow-up:      Follow-up Information    Please follow up. (PCP with [facility] in 1week, call for appt upon discharge)          Discharge Diagnoses:  Principal Problem:  *Acute pancreatitis Active Problems:  Hypertension  Diabetes mellitus without complication  CHF (congestive heart failure)  Acute renal insufficiency   Discharge Condition: improved/stable  Diet recommendation:modified carb  Filed Weights   09/05/12 0300 09/06/12 0405 09/07/12 0458  Weight: 88.3 kg (194 lb 10.7 oz) 88.4 kg (194 lb 14.2 oz) 88 kg (194 lb 0.1 oz)    History of present illness:  Yosmar Ryker is a 51 y.o. male who presents to the ED with c/o Abdominal pain. Symptoms onset 4PM on 12/24, gradually with progressive worsening. Quality is that it feels like the pain "when I had a gangrenous gallbladder". Pain is improved with walking worse with lying in certain positions. Pain is located in his epigastric area.  In the ED his work up was remarkable for a lipase of over 2700. His creatinine was 1.40 which is a slight elevation from his baseline of about 1.2. He was admitted for further evaluation and management.   Hospital Course:  Acute pancreatitis.  No clear etiology. Patient informs of drinking occasionally. No medications that could contribute to acute pancreatitis. Patient has had history of cholecystectomy. - Upon admission he had a Right upper quadrant ultrasound does not show any CBD stones. However does have some pancreatic edema suggestive of early pancreatitis with edema.. CT abdomen done shows mild peripancreatic inflammatory changes involving the  pancreatic head/uncinate process.  -As he improved he was advanced to carb mod diet on 12/28, as  his lipase had trended from 2700 on admission down to 97. After his diet was advanced he was tolerating it but his lipase was noted to trend back up to 148 and so he was monitored in the hospital and he continued to tolerate his diet and on recheck today his lipase is down to 108. -He has continued to do well tolerating by mouth's well with no nausea or vomiting and not requiring pain medicines. He has remained afebrile and hemodynamically. -He'll be discharged today and the importance of not drinking alcohol was emphasized. His to followup with his PCP outpatient. Diabetes mellitus with hypoglycemia -hypoglycemia on 12/26 with fsg down to 48 requiring D50 and D5NS. His A1C is quite high . He was changed to sensitive sliding scale with meals and lantus at bedtime while in the hospital.  - still no further hypoglycemic episodes and better BG control- she is to continue his outpatient medications upon discharge - It was noted patient on NPH 10 units BID at home and is to continue this and. CHF  -History of CAD with CABG  Currently euvolemic. Continue digoxin, hyralazine, Coreg aspirin and Lasix. Continue Imdur Zocor  Recent echo 6 weeks back showing EF of 60-65% with grade 2 diastolic dysfunction. He was monitored for For CHF symptoms while on IV fluids and remained euvolemic.  -[facility]. He was admitted to the hospital 6 weeks back and had a cardiac cath for chest pain.  -he is to followup at [facility] had vascular as previously directed CK D.  Appears at baseline. Creatinine today prior  to discharge is 1.2 week. Hypokalemia  Recheck in am 12/31   -He requested a refill of his other preadmission medications and this has been done.   Procedures:  none  Consultations:  none  Discharge Exam: Filed Vitals:   09/06/12 2033 09/06/12 2215 09/07/12 0458 09/07/12 0630  BP: 138/77  146/72 146/75  Pulse: 56 52 61 57  Temp: 97.7 F (36.5 C)  98 F  (36.7 C)   TempSrc: Oral  Oral   Resp: 18  18   Height:      Weight:   88 kg (194 lb 0.1 oz)   SpO2: 100%  100%     Exam:  General: Middle aged male in NAD  HEENT: no pallor, moist oral mucosa  Cardiovascular: N S1&S2, no murmurs  Respiratory: clear b/l, no added sounds  Abdomen: soft, Mild LUQ tenderness, ND, BS+  Ext: warm, no edema  CNS: AAOX 3   Discharge Instructions  Discharge Orders    Future Orders Please Complete By Expires   Diet Carb Modified      Increase activity slowly          Medication List     As of 09/07/2012  8:47 AM    STOP taking these medications         lisinopril 20 MG tablet   Commonly known as: PRINIVIL,ZESTRIL      TAKE these medications         amLODipine 10 MG tablet   Commonly known as: NORVASC   Take 1 tablet (10 mg total) by mouth daily.      aspirin 81 MG tablet   Take 81 mg by mouth daily.      carvedilol 25 MG tablet   Commonly known as: COREG   Take 1 tablet (25 mg total) by mouth 2 (two) times daily with a meal.      digoxin 0.125 MG tablet   Commonly known as: LANOXIN   Take 1 tablet (0.125 mg total) by mouth at bedtime.      furosemide 40 MG tablet   Commonly known as: LASIX   Take 1 tablet (40 mg total) by mouth daily.      hydrALAZINE 25 MG tablet   Commonly known as: APRESOLINE   Take 3 tablets (75 mg total) by mouth 3 (three) times daily.      Hydrocodone-Acetaminophen 5-300 MG Tabs   Take 5-300 mg by mouth every 6 (six) hours as needed.      insulin aspart protamine-insulin aspart (70-30) 100 UNIT/ML injection   Commonly known as: NOVOLOG 70/30   Inject 10 Units into the skin 2 (two) times daily with a meal.      isosorbide mononitrate 15 mg Tb24   Commonly known as: IMDUR   Take 0.5 tablets (15 mg total) by mouth daily.      nitroGLYCERIN 0.4 MG SL tablet   Commonly known as: NITROSTAT   Place 1 tablet (0.4 mg total) under the tongue every 5 (five) minutes x 3 doses as needed for chest pain.       potassium chloride SA 20 MEQ tablet   Commonly known as: K-DUR,KLOR-CON   Take 1 tablet (20 mEq total) by mouth daily.      pravastatin 40 MG tablet   Commonly known as: PRAVACHOL   Take 1 tablet (40 mg total) by mouth daily.           Follow-up Information    Please follow up. (PCP with  [facility] in 1week, call for appt upon discharge)           The results of significant diagnostics from this hospitalization (including imaging, microbiology, ancillary and laboratory) are listed below for reference.    Significant Diagnostic Studies: Ct Abdomen Wo Contrast  14-Sep-2012  **ADDENDUM** CREATED: 09-14-2012 17:43:23  Addendum to the findings section of the original report:  Additional mild peripancreatic inflammatory changes along the pancreatic tail (series 2/images 22-24).  The remainder of the report is unchanged.  **END ADDENDUM** SIGNED BY: Charline Bills, M.D.   Sep 14, 2012  *RADIOLOGY REPORT*  Clinical Data:  Reevaluate acute pancreatitis  CT ABDOMEN WITHOUT CONTRAST  Technique:  Multidetector CT imaging of the abdomen was performed following the standard protocol without IV contrast.  Comparison:  None.  Findings:  Mild linear scarring versus atelectasis at the left lung base.  Unenhanced liver, spleen, and adrenal glands are unremarkable.  Appearance of the pancreatic head/uncinate process is very mildly heterogeneous, with possible mild peripancreatic inflammatory changes.  No drainable fluid collection/pseudocyst.  Status post cholecystectomy.  No intrahepatic or extrahepatic ductal dilatation.  Kidneys are unremarkable.  No renal calculi or hydronephrosis.  Atherosclerotic calcifications of the abdominal aorta and branch vessels.  No abdominal ascites.  Small retroperitoneal lymph nodes which do not meet pathologic CT size criteria.  Mild degenerative changes of the visualized thoracolumbar spine.  IMPRESSION: Possible mild peripancreatic inflammatory changes involving  the pancreatic head/uncinate process.  No drainable fluid collection/pseudocyst.   Original Report Authenticated By: Charline Bills, M.D.    US Abdomen Complete  September 14, 2012  *RADIOLOGY REPORT*  Clinical Data:  Abdominal pain and evaluate for pancreatitis.  COMPLETE ABDOMINAL ULTRASOUND  Comparison:  None.  Findings:  Gallbladder:  Removed.  Common bile duct:  Measures 0.5 cm.  Liver:  No focal lesion identified.  Within normal limits in parenchymal echogenicity.  IVC:  Appears normal.  Pancreas:  There is heterogeneity of the pancreatic body.  No significant pancreatic duct dilatation.  Spleen:  Measures 8.5 cm in length.  No gross abnormality.  Right Kidney:  Right kidney measures 11.2 cm in length without hydronephrosis.  Left Kidney:  Left kidney measures 10.9 cm in length.  There is a 1.1 cm hypoechoic structure within the left kidney which is poorly characterized.  Negative for hydronephrosis.  Abdominal aorta:  No aneurysm identified.  IMPRESSION: The pancreas is heterogeneous and may represent areas of edema. Findings would be consistent with pancreatitis.  If there is concern for pseudocyst formations, recommend further characterization with an abdominal CT.  Indeterminate 1.1 cm hypoechoic structure in the left kidney.  This structure is too small to definitively characterize but may represents a cyst.   Original Report Authenticated By: Richarda Overlie, M.D.     Microbiology: No results found for this or any previous visit (from the past 240 hour(s)).   Labs: Basic Metabolic Panel:  Lab 09/07/12 1610 09/05/12 0440 09/03/12 0535 2012/09/14 1606 09/14/2012 1355  NA 137 142 140 138 142  K 3.6 3.6 3.4* 3.9 4.2  CL 101 106 106 103 106  CO2 27 27 26 23 29   GLUCOSE 233* 120* 190* 197* 121*  BUN 17 14 15 19 19   CREATININE 1.28 1.27 1.15 1.28 1.41*  CALCIUM 9.0 9.3 8.3* 8.5 9.2  MG -- -- -- -- --  PHOS -- -- -- -- --   Liver Function Tests:  Lab 09/07/12 0600 09/01/12 2228  AST 18 14  ALT 11 12   ALKPHOS  47 60  BILITOT 0.3 0.2*  PROT 6.4 7.7  ALBUMIN 2.9* 3.5    Lab 09/07/12 0600 09/06/12 0917 09/05/12 0440 09/04/12 0447 09/03/12 0535  LIPASE 108* 148* 133* 97* 130*  AMYLASE -- -- -- -- --   No results found for this basename: AMMONIA:5 in the last 168 hours CBC:  Lab 09/02/12 1606 09/01/12 2228  WBC 7.7 7.8  NEUTROABS -- 4.1  HGB 10.7* 12.8*  HCT 32.6* 38.5*  MCV 85.8 86.7  PLT 166 195   Cardiac Enzymes: No results found for this basename: CKTOTAL:5,CKMB:5,CKMBINDEX:5,TROPONINI:5 in the last 168 hours BNP: BNP (last 3 results) No results found for this basename: PROBNP:3 in the last 8760 hours CBG:  Lab 09/07/12 0549 09/06/12 2038 09/06/12 1616 09/06/12 1144 09/06/12 0546  GLUCAP 193* 176* 261* 229* 102*       Signed:  Montez Cuda C  [facility] 09/07/2012, 8:47 AM

## 2012-09-07 NOTE — Progress Notes (Signed)
Patient was identified by Lia Foyer, LCSWA during coverage of currently staying at [facility] due to homeless situation. Plan per Luther Parody was for return to [facility].  Patient was ok for d/c today per MD back to [facility].  Patient left the hospital prior to CSW seeing him today but nursing reports that he declined a bus pass stating that he had money to get transportation. CSW contacted [facility] and notified them of patient's hospitalization and subsequent d/c today.  Patient's plans were to return to [facility] per nursing.  Lorri Frederick. [place]  [phone]

## 2012-09-07 NOTE — Progress Notes (Signed)
1200 discharge instructions and prescrpitions given to pt . Verbalized understanding.

## 2012-10-29 ENCOUNTER — Emergency Department (HOSPITAL_COMMUNITY)
Admission: EM | Admit: 2012-10-29 | Discharge: 2012-10-29 | Disposition: A | Discharge status: Home / Self Care | Payer: Medicaid Other | Attending: Emergency Medicine | Admitting: Emergency Medicine

## 2012-10-29 ENCOUNTER — Encounter (HOSPITAL_COMMUNITY): Payer: Self-pay | Admitting: Emergency Medicine

## 2012-10-29 DIAGNOSIS — F17200 Nicotine dependence, unspecified, uncomplicated: Secondary | ICD-10-CM | POA: Insufficient documentation

## 2012-10-29 DIAGNOSIS — E1169 Type 2 diabetes mellitus with other specified complication: Secondary | ICD-10-CM | POA: Insufficient documentation

## 2012-10-29 DIAGNOSIS — Z79899 Other long term (current) drug therapy: Secondary | ICD-10-CM | POA: Insufficient documentation

## 2012-10-29 DIAGNOSIS — I209 Angina pectoris, unspecified: Secondary | ICD-10-CM | POA: Insufficient documentation

## 2012-10-29 DIAGNOSIS — I10 Essential (primary) hypertension: Secondary | ICD-10-CM | POA: Insufficient documentation

## 2012-10-29 DIAGNOSIS — Z794 Long term (current) use of insulin: Secondary | ICD-10-CM | POA: Insufficient documentation

## 2012-10-29 DIAGNOSIS — R739 Hyperglycemia, unspecified: Secondary | ICD-10-CM

## 2012-10-29 DIAGNOSIS — I252 Old myocardial infarction: Secondary | ICD-10-CM | POA: Insufficient documentation

## 2012-10-29 DIAGNOSIS — F411 Generalized anxiety disorder: Secondary | ICD-10-CM | POA: Insufficient documentation

## 2012-10-29 DIAGNOSIS — I2510 Atherosclerotic heart disease of native coronary artery without angina pectoris: Secondary | ICD-10-CM | POA: Insufficient documentation

## 2012-10-29 DIAGNOSIS — I509 Heart failure, unspecified: Secondary | ICD-10-CM | POA: Insufficient documentation

## 2012-10-29 DIAGNOSIS — E7800 Pure hypercholesterolemia, unspecified: Secondary | ICD-10-CM | POA: Insufficient documentation

## 2012-10-29 DIAGNOSIS — Z7982 Long term (current) use of aspirin: Secondary | ICD-10-CM | POA: Insufficient documentation

## 2012-10-29 DIAGNOSIS — Z8673 Personal history of transient ischemic attack (TIA), and cerebral infarction without residual deficits: Secondary | ICD-10-CM | POA: Insufficient documentation

## 2012-10-29 DIAGNOSIS — F3289 Other specified depressive episodes: Secondary | ICD-10-CM | POA: Insufficient documentation

## 2012-10-29 DIAGNOSIS — F329 Major depressive disorder, single episode, unspecified: Secondary | ICD-10-CM | POA: Insufficient documentation

## 2012-10-29 DIAGNOSIS — R350 Frequency of micturition: Secondary | ICD-10-CM | POA: Insufficient documentation

## 2012-10-29 LAB — CBC WITH DIFFERENTIAL/PLATELET
Basophils Absolute: 0 10*3/uL (ref 0.0–0.1)
HCT: 41.3 % (ref 39.0–52.0)
Lymphocytes Relative: 33 % (ref 12–46)
Monocytes Absolute: 0.5 10*3/uL (ref 0.1–1.0)
Neutro Abs: 4.9 10*3/uL (ref 1.7–7.7)
RDW: 12.5 % (ref 11.5–15.5)
WBC: 8.8 10*3/uL (ref 4.0–10.5)

## 2012-10-29 LAB — GLUCOSE, CAPILLARY
Glucose-Capillary: 451 mg/dL — ABNORMAL HIGH (ref 70–99)
Glucose-Capillary: 272 mg/dL — ABNORMAL HIGH (ref 70–99)

## 2012-10-29 LAB — URINALYSIS, ROUTINE W REFLEX MICROSCOPIC
Bilirubin Urine: NEGATIVE
Hgb urine dipstick: NEGATIVE
Protein, ur: 100 mg/dL — AB
Urobilinogen, UA: 0.2 mg/dL (ref 0.0–1.0)

## 2012-10-29 LAB — POCT I-STAT 3, VENOUS BLOOD GAS (G3P V)
O2 Saturation: 39 %
TCO2: 33 mmol/L (ref 0–100)
pCO2, Ven: 54.7 mmHg — ABNORMAL HIGH (ref 45.0–50.0)
pO2, Ven: 24 mmHg — CL (ref 30.0–45.0)

## 2012-10-29 LAB — COMPREHENSIVE METABOLIC PANEL
ALT: 19 U/L (ref 0–53)
AST: 25 U/L (ref 0–37)
Alkaline Phosphatase: 67 U/L (ref 39–117)
CO2: 29 mEq/L (ref 19–32)
Chloride: 98 mEq/L (ref 96–112)
Creatinine, Ser: 1.11 mg/dL (ref 0.50–1.35)
GFR calc non Af Amer: 74 mL/min — ABNORMAL LOW (ref >90)
Sodium: 137 mEq/L (ref 135–145)
Total Bilirubin: 0.4 mg/dL (ref 0.3–1.2)

## 2012-10-29 LAB — URINE MICROSCOPIC-ADD ON

## 2012-10-29 MED ORDER — SODIUM CHLORIDE 0.9 % IV BOLUS (SEPSIS)
1000.0000 mL | Freq: Once | INTRAVENOUS | Status: AC
  Administered 2012-10-29: 1000 mL via INTRAVENOUS

## 2012-10-29 MED ORDER — INSULIN ASPART 100 UNIT/ML ~~LOC~~ SOLN
5.0000 [IU] | Freq: Once | SUBCUTANEOUS | Status: AC
  Administered 2012-10-29: 5 [IU] via SUBCUTANEOUS
  Filled 2012-10-29: qty 1

## 2012-10-29 MED ORDER — SODIUM CHLORIDE 0.9 % IV BOLUS (SEPSIS): 2000.0000 mL | Freq: Once | INTRAVENOUS | Status: DC

## 2012-10-29 NOTE — ED Notes (Signed)
Pt states he has not been regularly checking cbg regularly due to not being able to afford test strips. Pt states he takes Novolog 70/30 for glycemic control but did not take his today.

## 2012-10-29 NOTE — ED Provider Notes (Addendum)
History     CSN: [id number]  Arrival date & time 10/29/12  1248   First MD Initiated Contact with Patient 10/29/12 1302      Chief Complaint  Patient presents with  . Hyperglycemia    (Consider location/radiation/quality/duration/timing/severity/associated sxs/prior treatment) The history is provided by the patient.  [name] is a 52 y.o. male history of diabetes on insulin, hypertension, MI here presenting with hyperglycemia. He was diagnosed with pancreatitis 2 months ago and was admitted for about a week. After he was discharged he has been noncompliant on his meds but has been using Novolin 70/30 as well as prenatal insulin. He said his sugar has been running high but she has not be testing him very often. He knows that for the last several weeks he's been drinking more water and been urinating more. Denies any fevers or chills or vomiting. His epigastric pain has been chronic but is able to tolerate food.   Past Medical History  Diagnosis Date  . CHF (congestive heart failure)   . Hypertension   . Diabetes mellitus without complication   . High cholesterol   . CAD (coronary artery disease)   . Depression   . Anxiety   . Myocardial infarction   . Anginal pain   . Stroke 2012    Past Surgical History  Procedure Laterality Date  . Cholecystectomy    . Coronary artery bypass graft    . Cholecystectomy      History reviewed. No pertinent family history.  History  Substance Use Topics  . Smoking status: Current Every Day Smoker -- 0.25 packs/day for 40 years    Types: Cigarettes  . Smokeless tobacco: Never Used  . Alcohol Use: Yes     Comment: occasional       Review of Systems  Genitourinary: Positive for frequency.  All other systems reviewed and are negative.    Allergies  Review of patient's allergies indicates no known allergies.  Home Medications   Current Outpatient Rx  Name  Route  Sig  Dispense  Refill  . amLODipine (NORVASC) 10 MG  tablet   Oral   Take 1 tablet (10 mg total) by mouth daily.   31 tablet   0   . aspirin 81 MG tablet   Oral   Take 81 mg by mouth daily.         . carvedilol (COREG) 25 MG tablet   Oral   Take 1 tablet (25 mg total) by mouth 2 (two) times daily with a meal.   62 tablet   0   . digoxin (LANOXIN) 0.125 MG tablet   Oral   Take 1 tablet (0.125 mg total) by mouth at bedtime.   31 tablet   0   . furosemide (LASIX) 40 MG tablet   Oral   Take 1 tablet (40 mg total) by mouth daily.   30 tablet   0   . hydrALAZINE (APRESOLINE) 25 MG tablet   Oral   Take 3 tablets (75 mg total) by mouth 3 (three) times daily.   270 tablet   0   . Hydrocodone-Acetaminophen (VICODIN) 5-300 MG TABS   Oral   Take 5-300 mg by mouth every 6 (six) hours as needed.   20 each   0   . insulin aspart protamine-insulin aspart (NOVOLOG 70/30) (70-30) 100 UNIT/ML injection   Subcutaneous   Inject 10 Units into the skin 2 (two) times daily with a meal.   10 mL  0   . isosorbide mononitrate (IMDUR) 15 mg TB24   Oral   Take 0.5 tablets (15 mg total) by mouth daily.   30 tablet   0   . nitroGLYCERIN (NITROSTAT) 0.4 MG SL tablet   Sublingual   Place 1 tablet (0.4 mg total) under the tongue every 5 (five) minutes x 3 doses as needed for chest pain.   15 tablet   0   . potassium chloride SA (K-DUR,KLOR-CON) 20 MEQ tablet   Oral   Take 1 tablet (20 mEq total) by mouth daily.   30 tablet   0   . pravastatin (PRAVACHOL) 40 MG tablet   Oral   Take 1 tablet (40 mg total) by mouth daily.   30 tablet   0     BP 173/85  Pulse 92  Temp(Src) 98.2 F (36.8 C) (Oral)  Resp 20  SpO2 98%  Physical Exam  Nursing note and vitals reviewed. Constitutional: He is oriented to person, place, and time. He appears well-developed and well-nourished.  HENT:  Head: Normocephalic.  MM slightly dry   Eyes: Conjunctivae are normal. Pupils are equal, round, and reactive to light.  Neck: Normal range of  motion. Neck supple.  Cardiovascular: Normal rate, regular rhythm and normal heart sounds.   Pulmonary/Chest: Effort normal and breath sounds normal. No respiratory distress. He has no wheezes. He has no rales.  Abdominal: Soft. Bowel sounds are normal.  Mild epigastric tenderness, no rebound   Musculoskeletal: Normal range of motion.  Neurological: He is alert and oriented to person, place, and time.  Skin: Skin is warm.  Psychiatric: He has a normal mood and affect. His behavior is normal. Judgment and thought content normal.    ED Course  Procedures (including critical care time)  Labs Reviewed  GLUCOSE, CAPILLARY - Abnormal; Notable for the following:    Glucose-Capillary 451 (*)    All other components within normal limits  CBC WITH DIFFERENTIAL - Abnormal; Notable for the following:    Eosinophils Relative 6 (*)    All other components within normal limits  COMPREHENSIVE METABOLIC PANEL - Abnormal; Notable for the following:    Glucose, Bld 421 (*)    Albumin 3.3 (*)    GFR calc non Af Amer 74 (*)    GFR calc Af Amer 86 (*)    All other components within normal limits  URINALYSIS, ROUTINE W REFLEX MICROSCOPIC - Abnormal; Notable for the following:    Specific Gravity, Urine 1.033 (*)    Glucose, UA >1000 (*)    Protein, ur 100 (*)    All other components within normal limits  GLUCOSE, CAPILLARY - Abnormal; Notable for the following:    Glucose-Capillary 352 (*)    All other components within normal limits  GLUCOSE, CAPILLARY - Abnormal; Notable for the following:    Glucose-Capillary 272 (*)    All other components within normal limits  POCT I-STAT 3, BLOOD GAS (G3P V) - Abnormal; Notable for the following:    pH, Ven 7.364 (*)    pCO2, Ven 54.7 (*)    pO2, Ven 24.0 (*)    Bicarbonate 31.2 (*)    Acid-Base Excess 4.0 (*)    All other components within normal limits  KETONES, QUALITATIVE  LIPASE, BLOOD  URINE MICROSCOPIC-ADD ON   No results found.   1.  Hyperglycemia       MDM  [name] is a 52 y.o. male here with hyperglycemia. Will r/o DKA but  I think its likely from med uncompliance. Will get lipase to r/o worsening pancreatitis.   3:39 PM Labs showed no AG. CBG went from 420 down to 272 with 1L NS and 5 U regular insulin. Not acidotic on vbg. Tolerated PO here, lipase nl. I told him to take his novolog as prescribed (he still has some at home) and check his sugars frequently. Return precautions given.        Richardean Canal, MD 10/29/12 1536  Richardean Canal, MD 10/29/12 1539

## 2012-10-29 NOTE — ED Notes (Signed)
Discharge instructions reviewed. Pt verbalized understanding.  

## 2012-10-29 NOTE — ED Notes (Signed)
Pt c/o hyperglycemia and sent here for eval by PCP; pt CBG 551; pt sts PU/PD as only complaint

## 2012-12-14 IMAGING — CR DG CHEST 2V
2 series · 2 of 2 positions shown · non-contrast
Comparison: None.

CLINICAL DATA: Shortness of breath x2 weeks

CHEST - 2 VIEW

[w chest pa]
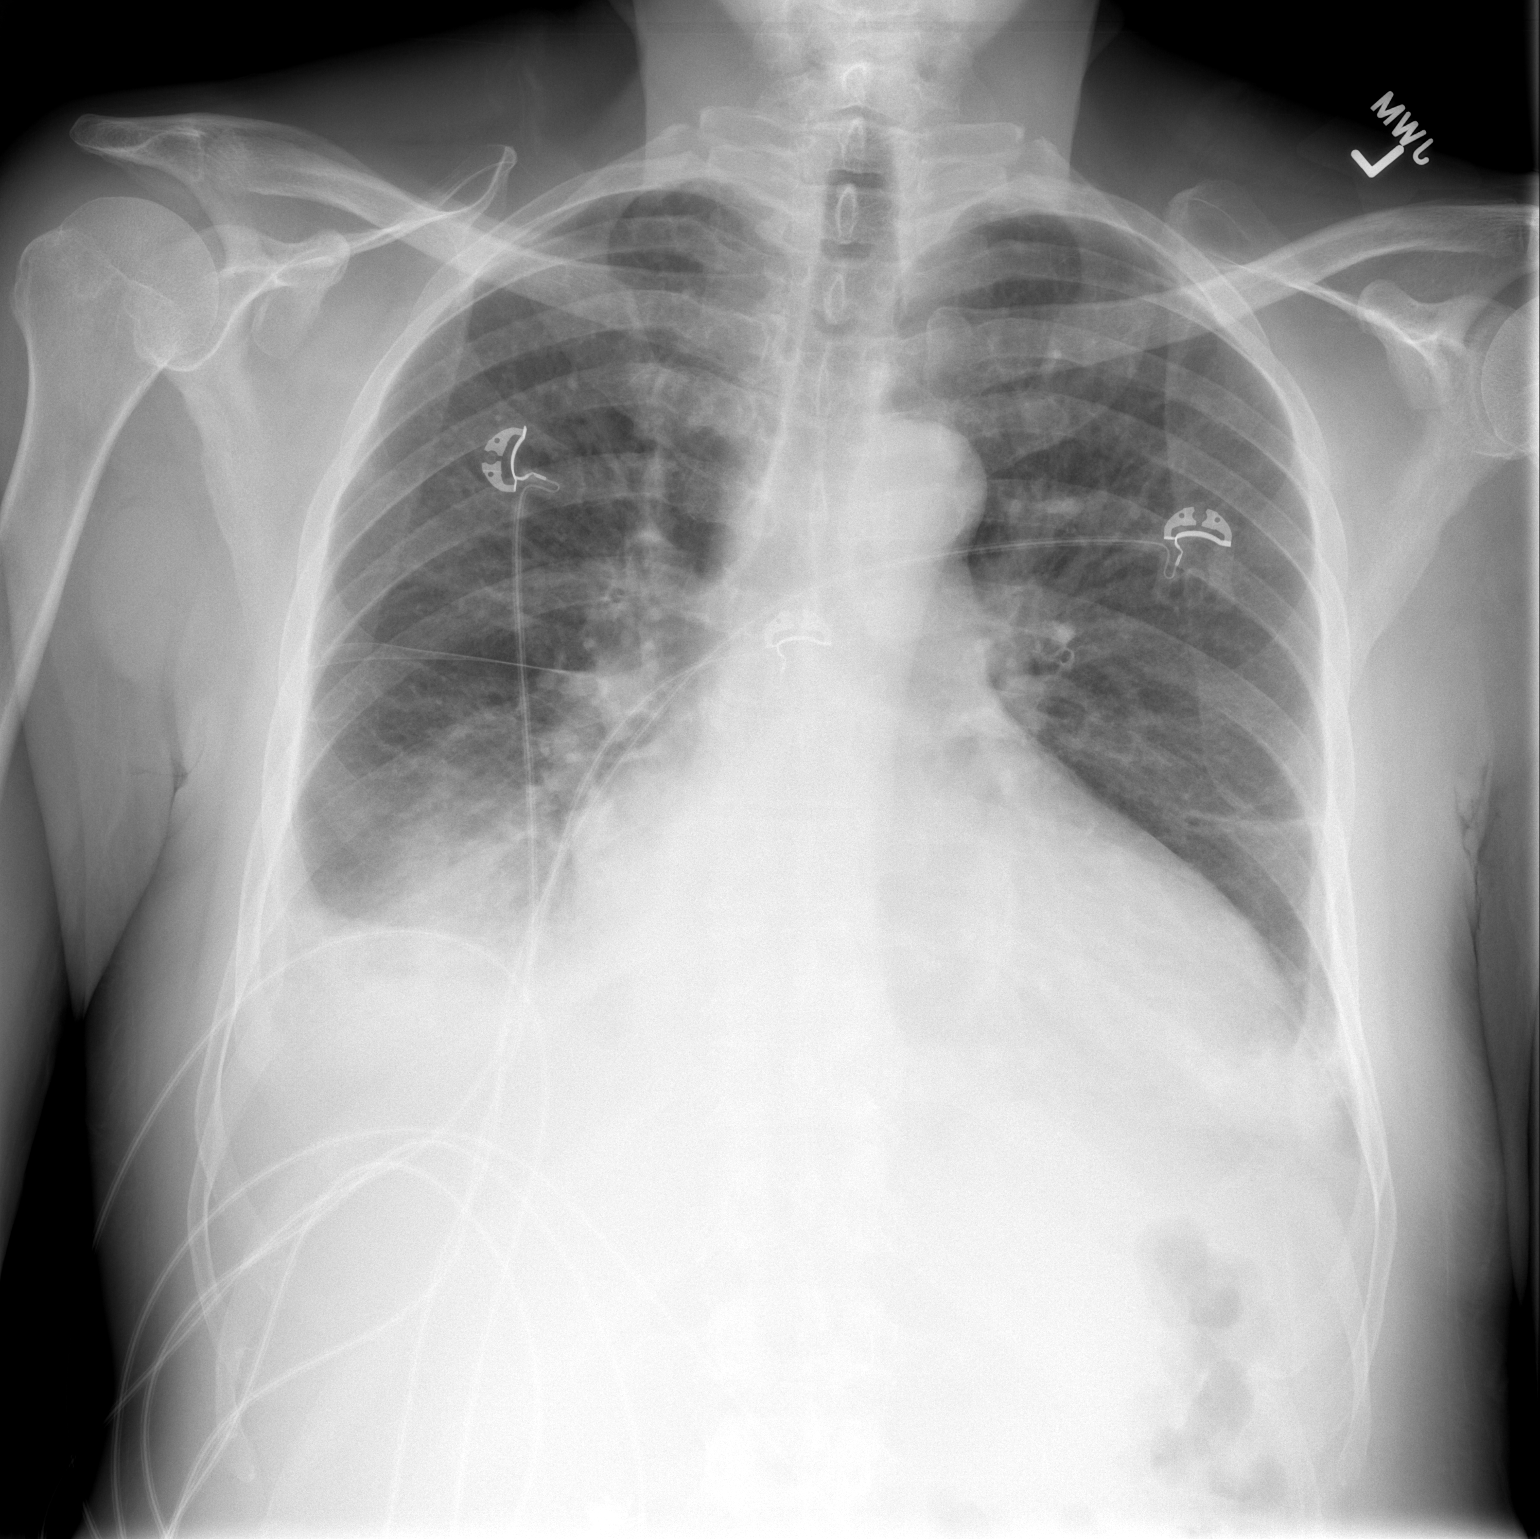

[w chest lat]
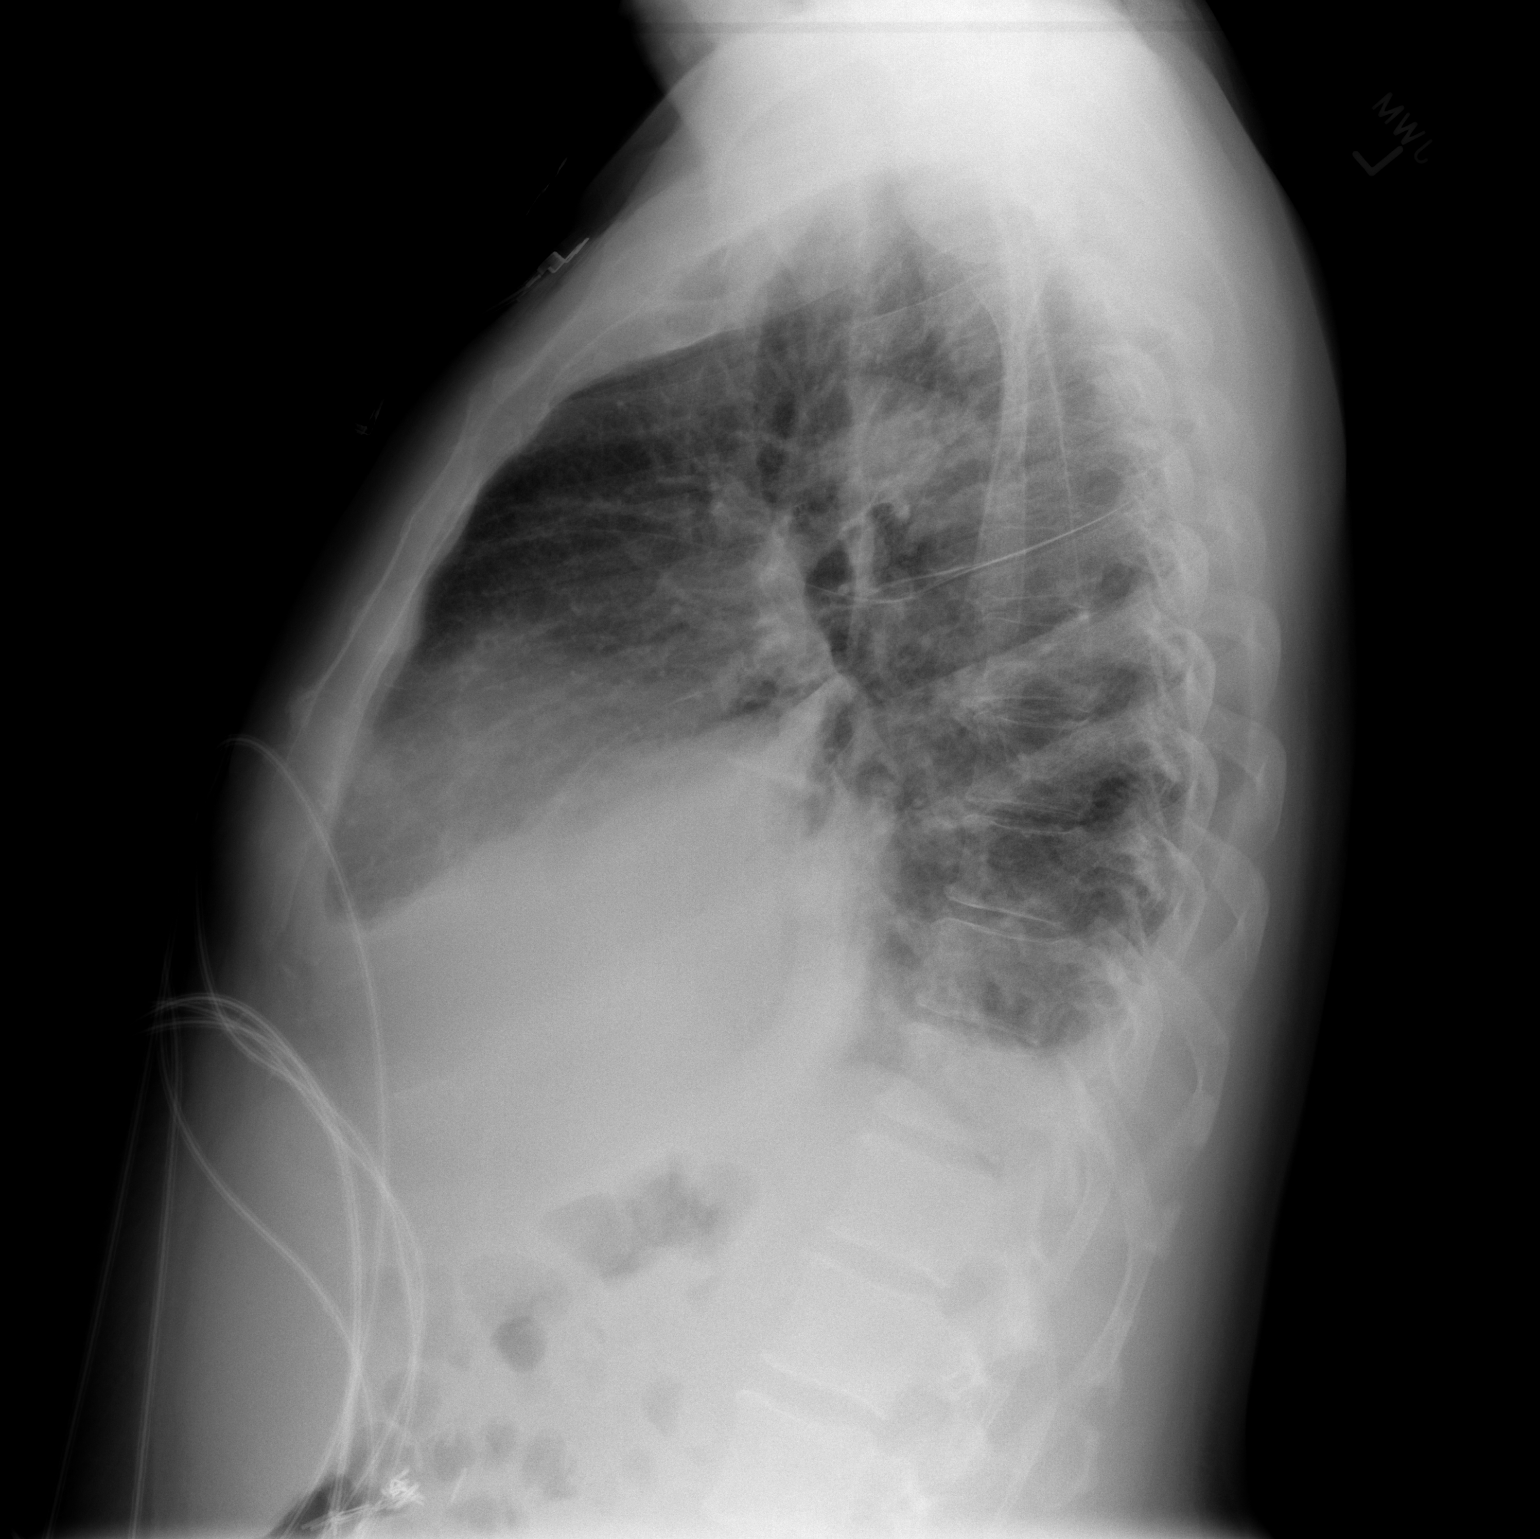

[2 of 2 positions shown; findings below may reference images not displayed]

FINDINGS: Bilateral lower lobe opacities, suspicious for pneumonia.
Right pleural effusion with associated right lower lobe
atelectasis. No pneumothorax.

Heart is enlarged.  Mediastinum is within normal limits.

Visualized osseous structures are within normal limits.
IMPRESSION: Bilateral lower lobe opacities, suspicious for pneumonia.

Right pleural effusion with associated right lower lobe
atelectasis. Mild cardiomegaly.

## 2013-01-19 ENCOUNTER — Ambulatory Visit: Payer: Medicaid Other | Admitting: Pharmacist Clinician (PhC)/ Clinical Pharmacy Specialist

## 2014-08-17 ENCOUNTER — Encounter (HOSPITAL_COMMUNITY): Payer: Self-pay | Admitting: Cardiology

## 2014-10-20 IMAGING — CT CT ABDOMEN W/O CM
2 of 4 series · 17 of 46 positions shown, 19 images · non-contrast
Comparison: None.

***ADDENDUM*** CREATED: 09/02/2012 [DATE]

Addendum to the findings section of the original report:
Additional mild peripancreatic inflammatory changes along the
pancreatic tail (series 2/images 22-24).
The remainder of the report is unchanged.
***END ADDENDUM*** SIGNED BY: Ketan Karl, M.D.
CLINICAL DATA: Reevaluate acute pancreatitis
CT ABDOMEN WITHOUT CONTRAST
TECHNIQUE: Multidetector CT imaging of the abdomen was performed
following the standard protocol without IV contrast.

[Series 2: routine abdomen · axial · 0.70mm/px · z∈[-273,-53]mm · 14 of 50 slices shown, 16 images]
[im 3/50  soft-tissue]
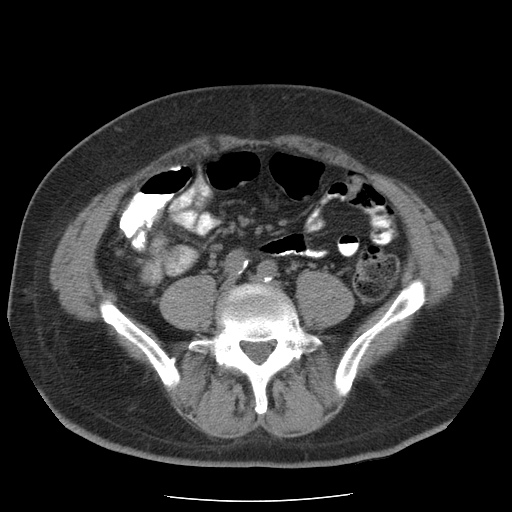
[im 3/50  bone]
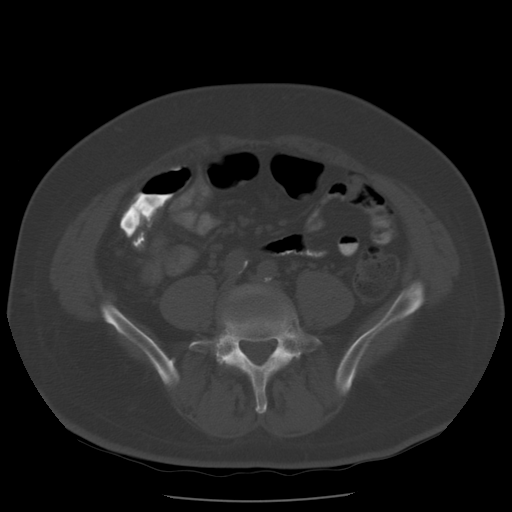
[im 8/50  soft-tissue]
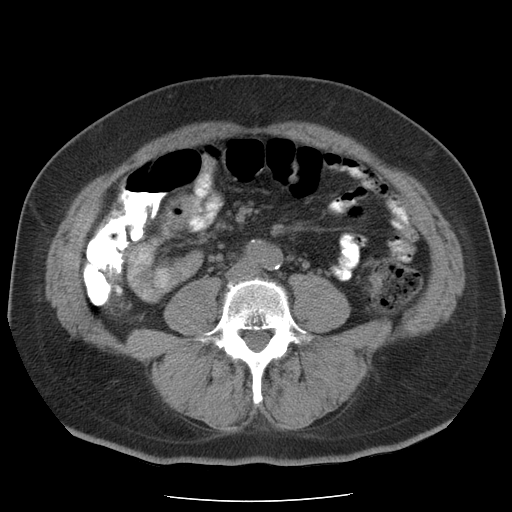
[im 10/50  soft-tissue]
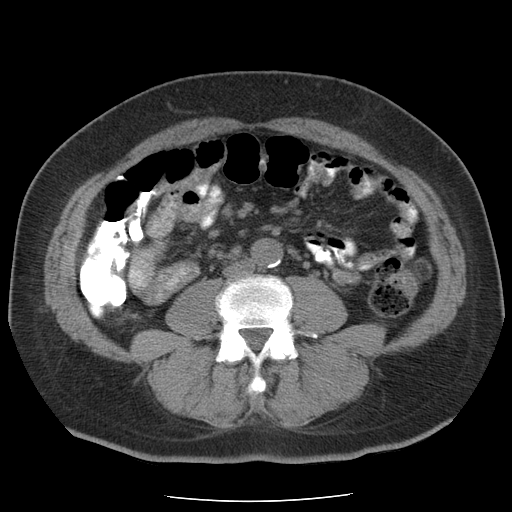
[im 15/50  soft-tissue]
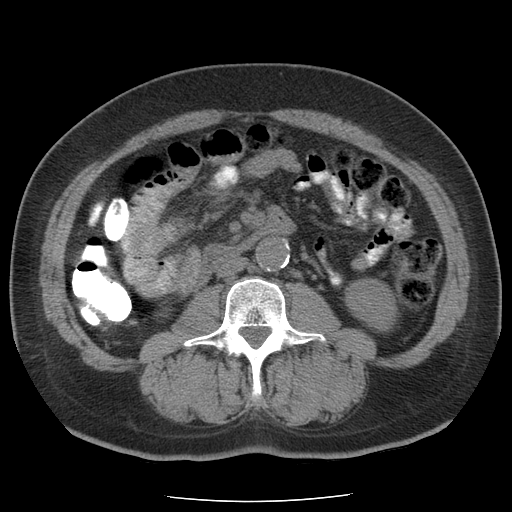
[im 17/50  soft-tissue]
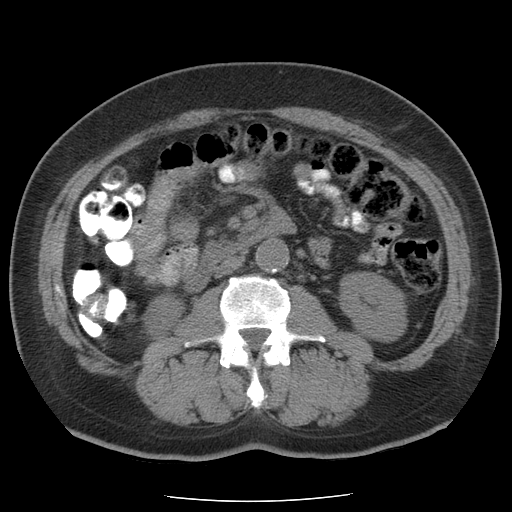
[im 19/50  soft-tissue]
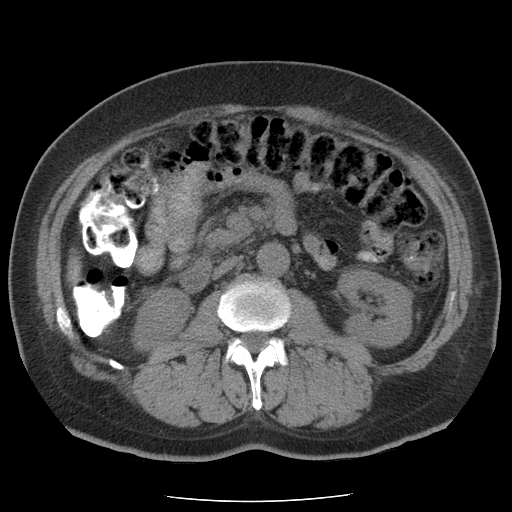
[im 24/50  soft-tissue]
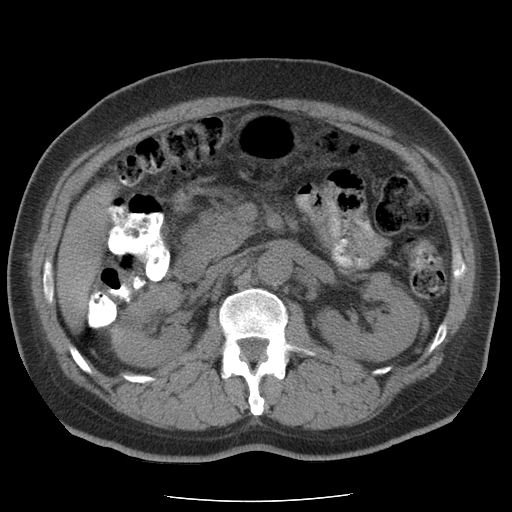
[im 26/50  soft-tissue]
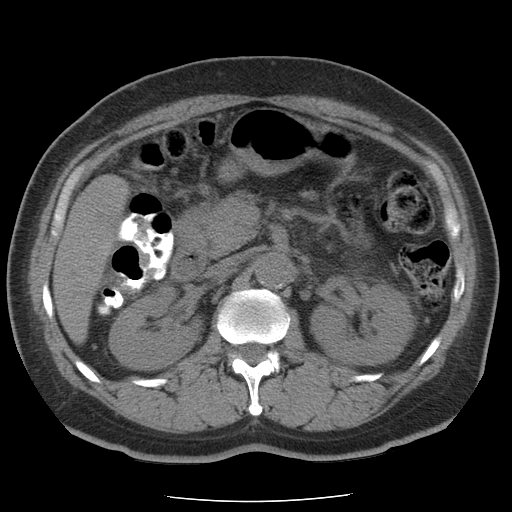
[im 31/50  soft-tissue]
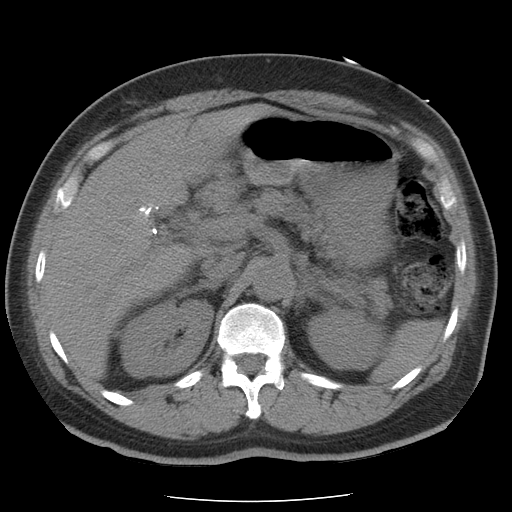
[im 31/50  bone]
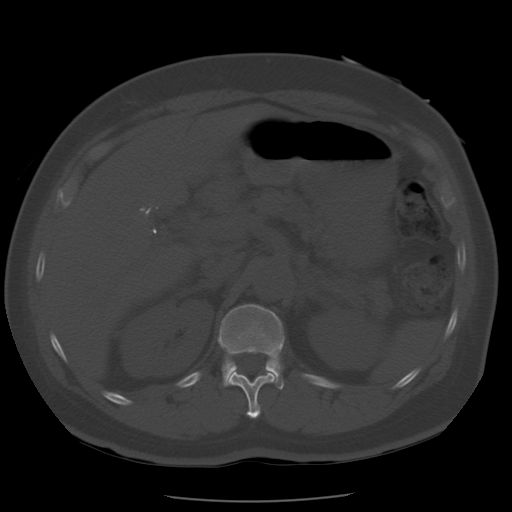
[im 33/50  soft-tissue]
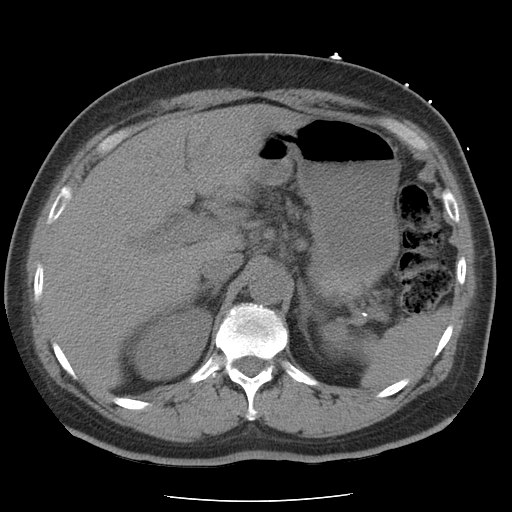
[im 38/50  soft-tissue]
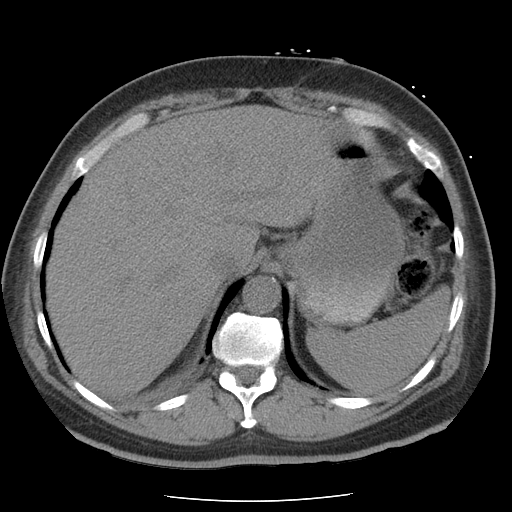
[im 40/50  soft-tissue]
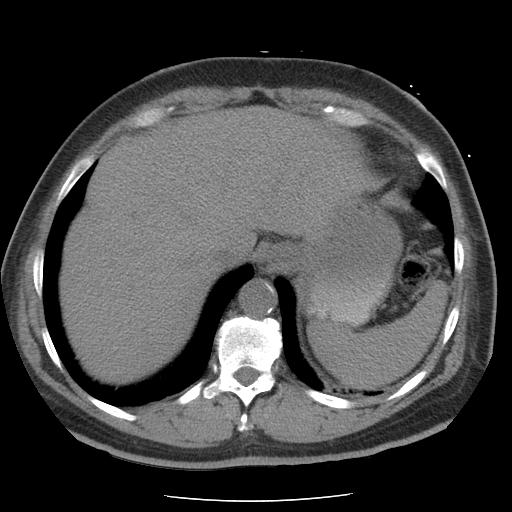
[im 43/50  soft-tissue]
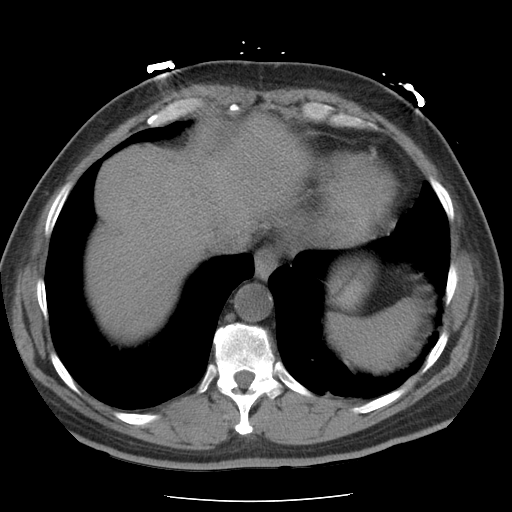
[im 47/50  soft-tissue]
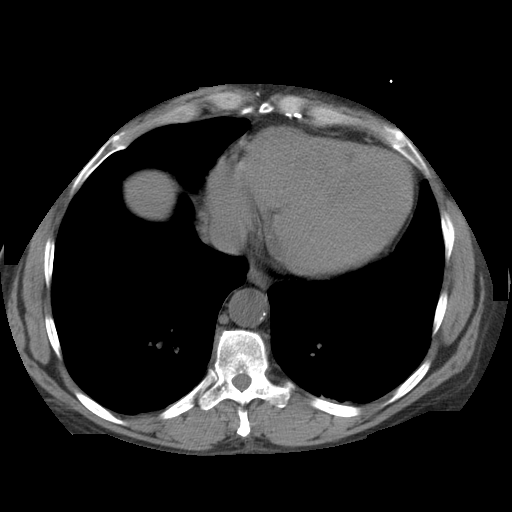

[Series 401: cor · coronal · 0.70mm/px · 3 of 100 slices shown]
[im 34/100  soft-tissue]
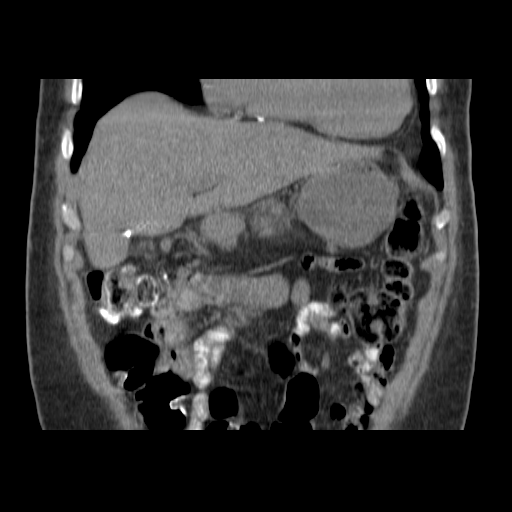
[im 45/100  soft-tissue]
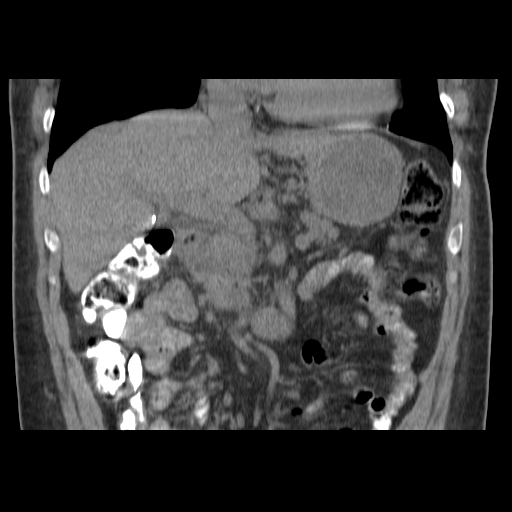
[im 56/100  soft-tissue]
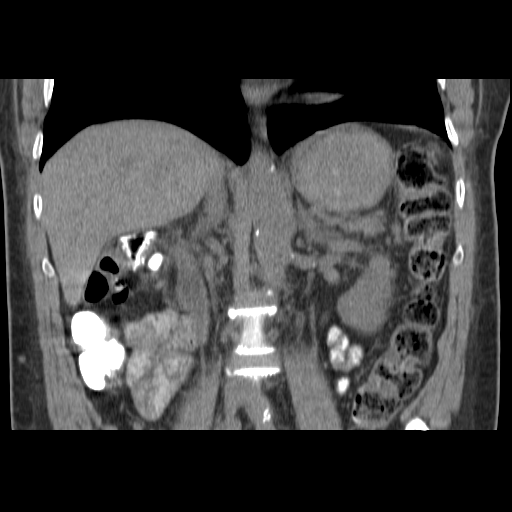

[17 of 46 positions shown; findings below may reference images not displayed]

FINDINGS: Mild linear scarring versus atelectasis at the left lung
base.

Unenhanced liver, spleen, and adrenal glands are unremarkable.

Appearance of the pancreatic head/uncinate process is very mildly
heterogeneous, with possible mild peripancreatic inflammatory
changes.

No drainable fluid collection/pseudocyst.

Status post cholecystectomy.  No intrahepatic or extrahepatic
ductal dilatation.

Kidneys are unremarkable.  No renal calculi or hydronephrosis.

Atherosclerotic calcifications of the abdominal aorta and branch
vessels.

No abdominal ascites.

Small retroperitoneal lymph nodes which do not meet pathologic CT
size criteria.

Mild degenerative changes of the visualized thoracolumbar spine.
IMPRESSION: Possible mild peripancreatic inflammatory changes involving the
pancreatic head/uncinate process.

No drainable fluid collection/pseudocyst.

## 2015-01-09 ENCOUNTER — Telehealth: Payer: Self-pay | Admitting: *Deleted

## 2015-01-09 NOTE — Telephone Encounter (Signed)
DENTAL PROCEDURE  NOT SCHEDULE YET. PER DR SADLER, THE PATIENT WIL BE HAVING DENTAL PROCEDURES DONE THAT MAY CREATE A TRANSIENT SEPTICEMIA.    EXTRACTION OF TOOTH #22 /SCALING ANS ROOT PLANING AND COPLETE UPPER DENTURES AND LOWER PARTIAL DENTURE  IF PATIENT NEEDS PROPHYLACTIC ANTIBIOTIC - PREMEDICATION BEFORE HIGH RISK  DENTAL PROCEDURES.

## 2015-01-10 NOTE — Telephone Encounter (Signed)
I have not seen him since 2013. I don't think he has any valve disease though. Should not be a problem as far as Abx prophylaxis.   OK to hold ASA if needed.  DH

## 2015-01-10 NOTE — Telephone Encounter (Signed)
ROUTED  INFORMATION  NOTE TO DR. ERIC SADLER

## 2015-03-28 ENCOUNTER — Encounter: Payer: Self-pay | Admitting: *Deleted

## 2015-05-08 ENCOUNTER — Encounter: Payer: Self-pay | Admitting: Cardiology
